# Patient Record
Sex: Male | Born: 1955
Health system: Southern US, Community
[De-identification: ages and names within clinical notes are randomized; demographics above are authoritative.]

## PROBLEM LIST (undated history)

## (undated) HISTORY — PX: EYELID CARCINOMA EXCISION: SHX1563

## (undated) HISTORY — PX: VASECTOMY: SHX75

---

## 1999-05-13 ENCOUNTER — Ambulatory Visit (HOSPITAL_BASED_OUTPATIENT_CLINIC_OR_DEPARTMENT_OTHER): Admission: RE | Admit: 1999-05-13 | Discharge: 1999-05-13 | Payer: Self-pay | Admitting: Podiatry

## 2001-10-19 ENCOUNTER — Encounter: Payer: Self-pay | Admitting: Gastroenterology

## 2001-10-19 ENCOUNTER — Encounter: Admission: RE | Admit: 2001-10-19 | Discharge: 2001-10-19 | Payer: Self-pay | Admitting: Gastroenterology

## 2002-10-02 ENCOUNTER — Ambulatory Visit (HOSPITAL_COMMUNITY): Admission: RE | Admit: 2002-10-02 | Discharge: 2002-10-02 | Payer: Self-pay | Admitting: Gastroenterology

## 2002-10-02 ENCOUNTER — Encounter: Payer: Self-pay | Admitting: Gastroenterology

## 2005-03-03 ENCOUNTER — Encounter (INDEPENDENT_AMBULATORY_CARE_PROVIDER_SITE_OTHER): Payer: Self-pay | Admitting: *Deleted

## 2005-03-03 ENCOUNTER — Ambulatory Visit (HOSPITAL_COMMUNITY): Admission: RE | Admit: 2005-03-03 | Discharge: 2005-03-03 | Payer: Self-pay | Admitting: Gastroenterology

## 2006-05-12 ENCOUNTER — Ambulatory Visit (HOSPITAL_COMMUNITY): Admission: RE | Admit: 2006-05-12 | Discharge: 2006-05-12 | Payer: Self-pay | Admitting: *Deleted

## 2006-06-18 ENCOUNTER — Ambulatory Visit (HOSPITAL_COMMUNITY): Admission: RE | Admit: 2006-06-18 | Discharge: 2006-06-18 | Payer: Self-pay | Admitting: Sports Medicine

## 2006-06-23 ENCOUNTER — Encounter: Admission: RE | Admit: 2006-06-23 | Discharge: 2006-06-23 | Payer: Self-pay | Admitting: Orthopedic Surgery

## 2006-07-14 ENCOUNTER — Encounter: Admission: RE | Admit: 2006-07-14 | Discharge: 2006-07-14 | Payer: Self-pay | Admitting: Orthopedic Surgery

## 2006-10-12 ENCOUNTER — Encounter: Admission: RE | Admit: 2006-10-12 | Discharge: 2006-10-12 | Payer: Self-pay | Admitting: Radiology

## 2010-09-24 NOTE — Op Note (Signed)
[place]. [facility]  Patient:    MARVIE CALENDER                        MRN: [id number] Proc. Date: 05/13/99 Adm. Date:  [id number] Attending:  Cordella Register                           Operative Report  INDICATIONS:  The patient presents to the office with extreme pain in the left first metacarpal phalangeal joint with the inability to bend the toe joint. This has been going on for an extended period of time.  The indications for surgery re chronic pain in his joint, with the inability to wear shoe gear.  SURGEON:  Alvia Grove Regal, D.P.M.  CONDITION:  Hallus valgus with hallux limitus deformity, left.  PROCEDURE:  Biplane osteotomy, first metatarsal, left, with pin fixation.  FINDINGS/DESCRIPTION OF PROCEDURE:  The patient was brought to the operating room and placed in the supine position on the operating room table.  The patient was  injected with a total of 10 cc of Xylocaine and Marcaine mixture.  The patients  left foot was prepped and draped utilizing a standard technique.  The left was exsanguinated utilizing an Esmarch.  The left ankle tourniquet was inflated to 00 mmHg.  The following procedure was performed:  [facility] biplane [place] bunionectomy, left foot, dorsal aspect, first metatarsal, where a linear incision was made just lateral to the extensor hallucis longus tendon.  The incision was deepened through the subcutaneous tissue at the level of the capsular where an inverted L-shaped  capsular tissue was performed.  The capsular tissue was sharply dissected off of the underlying bone, revealing a hyperostosis on the medial and dorsal aspect of the first metatarsal.  It had also some degeneration of cartilage on the plantar surface of the first metatarsal head.  The medial eminence and the dorsal eminence were resected flush with the shaft of the first metatarsal.  An osteotomy cut was then made in the first metatarsal head  with a biplaner double dorsal cut, and a  single plantar cut, so as to remove a wedge of dorsal bone, to allow for plantar flexion of the first metatarsal.  This was accomplished.  The first metatarsal as plantar-flexed, with fixation with a #0.045 K-wire. A rasp was introduced and all roughened bone edges were rasped smooth, and was flushed with copious amounts of sterile _______ solution.  The capsular tissues were reapproximated utilizing #3-0 Dexon in a continuous running fashion.  The subcutaneous tissue was reapproximated with sutures of #4-0 Dexon in a continuous running fashion.  The skin margins were reapproximated utilizing #5-0 Dexon in a subcuticular fashion.  The surgical site was infiltrated with 1 cc of dexamethasone, and a dry sterile compressive dressing applied to the left foot.  The left ankle tourniquet was deflated.  Capillary refill resulted in the left foot.  The patient was sent to the recovery room and was discharged by the department f anesthesia with postoperative instructions and medications. DD:  05/13/99 TD:  05/13/99 Job: [id number]

## 2010-09-24 NOTE — Op Note (Signed)
NAME:  Mitchell Jones, Mitchell Jones NO.:  [id number]   MEDICAL RECORD NO.:  [id number]          PATIENT TYPE:  AMB   LOCATION:  ENDO                         FACILITY:  [facility]   PHYSICIAN:  Petra Kuba, M.D.    DATE OF BIRTH:  1956/01/08   DATE OF PROCEDURE:  05/12/2006  DATE OF DISCHARGE:                               OPERATIVE REPORT   PROCEDURE:  Esophagogastroduodenoscopy with Savary dilatation.   INDICATIONS:  Dysphagia helped with dilatation in the past.  Consent was  signed after risks, benefits, methods, options thoroughly discussed in  the office on multiple occasions.   MEDICINES USED:  Fentanyl 50 mcg, Versed 6 mg.   PROCEDURE:  Video endoscope was inserted by direct vision.  The proximal  and mid esophagus were normal.  In the distal esophagus there was a  moderate amount of spasm.  He did have a tiny hiatal hernia with the 1.8  thin ring but very minimal inflammation was seen, but no other  abnormalities.  Scope passed in the stomach advanced through a normal  antrum.  Pylorus had a minimal amount of erythema and otherwise normal  pylorus into a normal duodenal bulb around the C-loop to a normal second  portion of the duodenum.  On slow withdrawal back to the bulb a slightly  bulbous ampulla was seen.  No other abnormalities.  Again a good look at  the bulb was normal.  Scope was withdrawn back the stomach and  retroflexed.  Cardia, fundus, angularis, lesser and greater curve were  normal on retroflex visualization.  Straight visualization of the  stomach did not reveal any additional findings.  Scope was slowly  withdrawn back to about 20 cm which again confirmed above esophageal  findings.  Scope was re-advanced to the antrum and under fluoro  guidance, the Savary wire was advanced.  The customary J loop to the  wire was confirmed endoscopically as well as fluoroscopically.  The  scope was removed under fluoro, making sure to keep the wire in proper  position.  Once the scope was removed.  The Savary 16-mm dilator only  was advanced under fluoro confirmed in the proper position in and  stomach.  There was no resistance in passing the dilator.  The wire was  withdrawn back into the dilator.  Both were removed in tandem.  There  was no heme on the dilator.  The procedure was terminated at this  junction.  The patient tolerated the procedure well.  There was no  obvious immediate complication.   ENDOSCOPIC DIAGNOSES:  1. Tiny hiatal hernia, very thin ring with minimal inflammation at the      gastroesophageal junction.  2. Moderate distal esophageal spasm.  3. Minimal peripyloric inflammation.  4. Bulbous ampulla.  5. Otherwise normal esophagogastroduodenoscopy.   THERAPY:  Savary dilatation under fluoro to 16 mm without heme and  resistance.   PLAN:  See how the dilation works, continue pump inhibitors, see back  p.r.n. or in 2 months to recheck symptoms to make sure no further more  aggressive therapy needed.  ______________________________  Petra Kuba, M.D.     MEM/MEDQ  D:  05/12/2006  T:  05/12/2006  Job:  [id number]   cc:   Theressa Millard, M.D.

## 2010-09-24 NOTE — Op Note (Signed)
NAME:  Mitchell Jones, Mitchell Jones                    ACCOUNT NO.:  [id number]   MEDICAL RECORD NO.:  [id number]                   PATIENT TYPE:  AMB   LOCATION:  ENDO                                 FACILITY:  [facility]   PHYSICIAN:  Petra Kuba, M.D.                 DATE OF BIRTH:  [birth year]   DATE OF PROCEDURE:  10/02/2002  DATE OF DISCHARGE:                                 OPERATIVE REPORT   PROCEDURE PERFORMED:  Esophagogastroduodenoscopy with Savary dilatation.   ENDOSCOPIST:  Petra Kuba, M.D.   INDICATIONS FOR PROCEDURE:  Patient with dysphagia.  Barium swallow  pertinent only for a small sliding hiatal hernia.  Consent was signed after  the risks, benefits, methods and options were thoroughly discussed in the  office.   MEDICINES USED:  Demerol 50 mg, Versed 7.5 mg.   DESCRIPTION OF PROCEDURE:  The video endoscope was inserted by direct  vision.  Esophagus was normal; however, in the distal esophagus was some  distal spasm.  There was a tiny hiatal hernia and a widened__________.  Scope passed into the stomach and advanced through a normal antrum, normal  pylorus into a normal duodenal bulb and around the C-loop to a normal second  portion of the duodenum.  The scope was withdrawn back to the bulb and a  good look there ruled out ulcers in that location.  Scope was withdrawn back  to the stomach and retroflexed.  The angularis, cardia and fundus were  normal.  The hiatal hernia was confirmed in the cardia.  The lesser and  greater curve were normal on retroflex and then straight visualization.  The  scope was then slowly withdrawn back to 20 cm. Again, no signs of  significant esophagitis or other abnormalities of the esophagus were seen.  The scope was readvanced into the antrum and under fluoro guidance, a Savary  wire was advanced.  The customary J-loop was confirmed under fluoroscopy.  The scope was removed and in succession, Savary 14 and 16 mm dilators were  both  advanced into the stomach, confirming proper position under  fluoroscopy.  Although he did not like [place] passing the dilators, there was no  resistance or heme on either of the dilators.  Both were confirmed in the  proper position in the stomach under fluoroscopy.  The wire was withdrawn  back into the 16 dilator.  Both were removed in tandem.  The patient  tolerated the procedure adequately.  There were no obvious immediate  complication.   ENDOSCOPIC DIAGNOSIS:  1. Tiny hiatal hernia.  Some gastroesophageal junction spasm.  Widely patent     thin ring.  2. Otherwise normal esophagogastroduodenoscopy.   THERAPY:  Savary dilatation to 16 mm under fluoroscopy without heme or  resistance.   PLAN:  See how the dilation works.  I have asked him to call me in one  month.  Consider a trial of pump inhibitors, antispasmodics,  possibly even  motility agents, more aggressive dilatations or even a manometry to rule out  atypical achalasia.  Have him call me sooner as needed.                                               Petra Kuba, M.D.    MEM/MEDQ  D:  10/02/2002  T:  10/02/2002  Job:  [id number]

## 2010-09-24 NOTE — Op Note (Signed)
NAME:  Mitchell Jones, Mitchell Jones NO.:  [id number]   MEDICAL RECORD NO.:  [id number]          PATIENT TYPE:  AMB   LOCATION:  ENDO                         FACILITY:  [facility]   PHYSICIAN:  Petra Kuba, M.D.    DATE OF BIRTH:  21-Nov-1955   DATE OF PROCEDURE:  03/03/2005  DATE OF DISCHARGE:                                 OPERATIVE REPORT   PROCEDURE:  Colonoscopy with biopsy.   INDICATIONS:  Screening.  Consent was signed after risks, benefits, methods,  options thoroughly discussed multiple times in the past.   MEDICINES USED:  Fentanyl 100 mcg Versed 8.   PROCEDURE:  Rectal inspection is pertinent for tiny external hemorrhoids.  Digital exam was negative. The video regular colonoscope was inserted.  Unfortunately at the sigmoid descending junction, was a tortuous turn and  despite some abdominal pressure and rolling him on his back, we could not  advance around the turn.  The scope was removed.  We went ahead and rolled  him back on his left side, and the pediatric video adjustable colonoscope  was inserted, and it was easier to advance around this tortuous curve.  Once  we were in this area with abdominal pressure, we were able to advance to the  cecum.  Other than a rare left-sided diverticula, no abnormality was seen on  insertion.  The cecum was identified by the appendiceal orifice and the  ileocecal valve.  In fact, the scope was inserted a short ways in the  terminal ileum which was normal.  Photodocumentation was obtained.  The  scope was slowly withdrawn.  Prep was adequate.  There was some liquid stool  that required washing and suctioning.  On slow withdrawal through the colon,  other than the rare left-sided diverticula and some tortuosity, no  abnormalities were seen until we withdrew back to the rectum where a tiny,  hyperplastic-appearing rectal polyp was seen, cold biopsied x2.  Anorectal  pull-through and retroflexion confirmed the tiny hemorrhoids.  Scope  was  straightened and readvanced a short ways up the left side of the colon.  Air  was suctioned, scope removed.  The patient tolerated the procedure  adequately.  There was no obvious immediate complication.   ENDOSCOPIC DIAGNOSES:  1.  Internal-external tiny hemorrhoids.  2.  Rare left-sided diverticula.  3.  Tiny rectal hyperplastic-appearing polyp, cold biopsied.  4.  Tortuous sigmoid descending junction, unable to advance the regular      scope but able to get the peds adjustable around it.  5.  Otherwise within normal limits to the terminal ileum.   PLAN:  Await pathology to determine future colonic screening.  If  hyperplastic, consider recheck in 5-10 years.  Might want to __________ next  time or even consider a virtual colonoscopy in the future or starting with  the peds colonoscope as above.  Happy to see back p.r.n., particularly if  upper tract symptoms return.  Otherwise, return care to Dr. Earl Gala for the  customary health care maintenance to include yearly rectals and guaiacs.  ______________________________  Petra Kuba, M.D.     MEM/MEDQ  D:  03/03/2005  T:  03/03/2005  Job:  [id number]   cc:   Theressa Millard, M.D.  Fax: [phone]

## 2011-09-09 ENCOUNTER — Other Ambulatory Visit: Payer: Self-pay | Admitting: Dermatology

## 2012-03-16 ENCOUNTER — Other Ambulatory Visit: Payer: Self-pay | Admitting: Dermatology

## 2013-04-25 ENCOUNTER — Other Ambulatory Visit: Payer: Self-pay | Admitting: Dermatology

## 2013-05-14 ENCOUNTER — Other Ambulatory Visit: Payer: Self-pay | Admitting: Dermatology

## 2013-08-01 DIAGNOSIS — M722 Plantar fascial fibromatosis: Secondary | ICD-10-CM

## 2013-08-02 ENCOUNTER — Encounter: Payer: Self-pay | Admitting: Podiatry

## 2013-09-20 ENCOUNTER — Encounter: Payer: Self-pay | Admitting: Podiatry

## 2014-02-13 ENCOUNTER — Other Ambulatory Visit: Payer: Self-pay | Admitting: Dermatology

## 2014-03-10 ENCOUNTER — Encounter: Payer: Self-pay | Admitting: Podiatry

## 2014-03-10 DIAGNOSIS — M722 Plantar fascial fibromatosis: Secondary | ICD-10-CM

## 2014-08-13 DIAGNOSIS — R52 Pain, unspecified: Secondary | ICD-10-CM

## 2015-03-25 ENCOUNTER — Ambulatory Visit (INDEPENDENT_AMBULATORY_CARE_PROVIDER_SITE_OTHER): Payer: 59 | Admitting: Podiatry

## 2015-03-25 ENCOUNTER — Ambulatory Visit (INDEPENDENT_AMBULATORY_CARE_PROVIDER_SITE_OTHER): Payer: 59

## 2015-03-25 DIAGNOSIS — L84 Corns and callosities: Secondary | ICD-10-CM

## 2015-03-25 DIAGNOSIS — M779 Enthesopathy, unspecified: Secondary | ICD-10-CM

## 2015-03-25 DIAGNOSIS — M216X9 Other acquired deformities of unspecified foot: Secondary | ICD-10-CM

## 2015-03-25 DIAGNOSIS — M79674 Pain in right toe(s): Secondary | ICD-10-CM

## 2015-03-25 MED ORDER — TRIAMCINOLONE ACETONIDE 10 MG/ML IJ SUSP
10.0000 mg | Freq: Once | INTRAMUSCULAR | Status: AC
  Administered 2015-03-25: 10 mg

## 2015-03-25 NOTE — Progress Notes (Signed)
   Subjective:    Patient ID: Mitchell Jones, male    DOB: August 20, 1955, 58 y.o.   MRN: [id number]  HPI  Pt presents with pain on the lateral side of his foot, currently has a callus lateral sub 5th met right foot  Review of Systems  All other systems reviewed and are negative.      Objective:   Physical Exam        Assessment & Plan:

## 2015-03-25 NOTE — Progress Notes (Signed)
Subjective:     Patient ID: Mitchell Jones, male   DOB: 08/23/1955, 59 y.o.   MRN: [id number]  HPI patient presents I have a lot of inflammation around the side of my right foot and it's been bothering me for a few months getting gradually worse and I think there might be some lesions there to. Patient states that it's been ongoing and gradually getting more symptomatic   Review of Systems  All other systems reviewed and are negative.      Objective:   Physical Exam  Constitutional: He is oriented to person, place, and time.  Cardiovascular: Intact distal pulses.   Musculoskeletal: Normal range of motion.  Neurological: He is oriented to person, place, and time.  Skin: Skin is warm.  Nursing note and vitals reviewed.  neurovascular status intact muscle strength adequate range of motion within normal limits with patient found to have keratotic lesion sub-fifth metatarsal right with inflammation and fluid around the metatarsal phalangeal joint that's painful. 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 Patient will be seen back as needed

## 2015-04-17 DIAGNOSIS — M779 Enthesopathy, unspecified: Secondary | ICD-10-CM

## 2015-04-27 ENCOUNTER — Other Ambulatory Visit: Payer: Self-pay | Admitting: Gastroenterology

## 2015-05-13 MED FILL — PANTOPRAZOLE SOD DR 20 MG T: 20 | 90 days supply | Qty: 90 | Fill #0

## 2015-05-21 MED FILL — ATORVASTATIN 20 MG TABLET: 20 | 30 days supply | Qty: 30 | Fill #0

## 2015-05-29 DIAGNOSIS — H524 Presbyopia: Secondary | ICD-10-CM | POA: Diagnosis not present

## 2015-05-29 DIAGNOSIS — H5203 Hypermetropia, bilateral: Secondary | ICD-10-CM | POA: Diagnosis not present

## 2015-05-29 MED FILL — CIALIS 20 MG TABLET: 20 | 30 days supply | Qty: 6 | Fill #9

## 2015-06-25 MED FILL — ATORVASTATIN 20 MG TABLET: 20 | 30 days supply | Qty: 30 | Fill #1

## 2015-06-29 MED FILL — CIALIS 20 MG TABLET: 20 | 30 days supply | Qty: 6 | Fill #10

## 2015-07-14 DIAGNOSIS — N138 Other obstructive and reflux uropathy: Secondary | ICD-10-CM | POA: Diagnosis not present

## 2015-07-14 DIAGNOSIS — N528 Other male erectile dysfunction: Secondary | ICD-10-CM | POA: Diagnosis not present

## 2015-07-14 DIAGNOSIS — N401 Enlarged prostate with lower urinary tract symptoms: Secondary | ICD-10-CM | POA: Diagnosis not present

## 2015-07-14 MED FILL — RAPAFLO 8 MG CAPSULE: 8 | 90 days supply | Qty: 90 | Fill #0

## 2015-07-17 DIAGNOSIS — L905 Scar conditions and fibrosis of skin: Secondary | ICD-10-CM | POA: Diagnosis not present

## 2015-07-17 DIAGNOSIS — Z85828 Personal history of other malignant neoplasm of skin: Secondary | ICD-10-CM | POA: Diagnosis not present

## 2015-07-17 DIAGNOSIS — L570 Actinic keratosis: Secondary | ICD-10-CM | POA: Diagnosis not present

## 2015-07-19 MED FILL — CIALIS 20 MG TABLET: 20 | 30 days supply | Qty: 6 | Fill #0

## 2015-07-23 MED FILL — ATORVASTATIN 20 MG TABLET: 20 | 30 days supply | Qty: 30 | Fill #2

## 2015-08-17 MED FILL — PANTOPRAZOLE SOD DR 20 MG T: 20 | 30 days supply | Qty: 30 | Fill #1

## 2015-08-28 MED FILL — ATORVASTATIN 20 MG TABLET: 20 | 30 days supply | Qty: 30 | Fill #3

## 2015-09-17 MED FILL — CIALIS 20 MG TABLET: 20 | 30 days supply | Qty: 6 | Fill #1

## 2015-09-22 MED FILL — ATORVASTATIN 20 MG TABLET: 20 | 30 days supply | Qty: 30 | Fill #4

## 2015-10-20 MED FILL — CIALIS 20 MG TABLET: 20 | 30 days supply | Qty: 6 | Fill #2

## 2015-10-26 MED FILL — ATORVASTATIN 20 MG TABLET: 20 | 30 days supply | Qty: 30 | Fill #5

## 2015-11-02 DIAGNOSIS — R1311 Dysphagia, oral phase: Secondary | ICD-10-CM | POA: Diagnosis not present

## 2015-11-02 DIAGNOSIS — K219 Gastro-esophageal reflux disease without esophagitis: Secondary | ICD-10-CM | POA: Diagnosis not present

## 2015-11-02 MED FILL — PANTOPRAZOLE SOD DR 40 MG T: 40 | 90 days supply | Qty: 90 | Fill #0

## 2015-11-17 MED FILL — CIALIS 20 MG TABLET: 20 | 30 days supply | Qty: 6 | Fill #3

## 2015-11-25 MED FILL — ATORVASTATIN 20 MG TABLET: 20 | 90 days supply | Qty: 90 | Fill #0

## 2015-12-28 MED FILL — CIALIS 20 MG TABLET: 20 | 30 days supply | Qty: 6 | Fill #4

## 2016-01-22 MED FILL — RAPAFLO 8 MG CAPSULE: 8 | 90 days supply | Qty: 90 | Fill #1

## 2016-01-28 MED FILL — CIALIS 20 MG TABLET: 20 | 30 days supply | Qty: 6 | Fill #5

## 2016-01-28 MED FILL — PANTOPRAZOLE SOD DR 40 MG T: 40 | 90 days supply | Qty: 90 | Fill #1

## 2016-03-03 MED FILL — ATORVASTATIN 20 MG TABLET: 20 | 90 days supply | Qty: 90 | Fill #1

## 2016-03-03 MED FILL — CIALIS 20 MG TABLET: 20 | 30 days supply | Qty: 6 | Fill #6

## 2016-03-21 DIAGNOSIS — Z125 Encounter for screening for malignant neoplasm of prostate: Secondary | ICD-10-CM | POA: Diagnosis not present

## 2016-03-21 DIAGNOSIS — R7301 Impaired fasting glucose: Secondary | ICD-10-CM | POA: Diagnosis not present

## 2016-03-21 DIAGNOSIS — Z0000 Encounter for general adult medical examination without abnormal findings: Secondary | ICD-10-CM | POA: Diagnosis not present

## 2016-03-21 DIAGNOSIS — E784 Other hyperlipidemia: Secondary | ICD-10-CM | POA: Diagnosis not present

## 2016-03-28 DIAGNOSIS — K219 Gastro-esophageal reflux disease without esophagitis: Secondary | ICD-10-CM | POA: Diagnosis not present

## 2016-03-28 DIAGNOSIS — R7301 Impaired fasting glucose: Secondary | ICD-10-CM | POA: Diagnosis not present

## 2016-03-28 DIAGNOSIS — F5221 Male erectile disorder: Secondary | ICD-10-CM | POA: Diagnosis not present

## 2016-03-28 DIAGNOSIS — E781 Pure hyperglyceridemia: Secondary | ICD-10-CM | POA: Diagnosis not present

## 2016-03-28 DIAGNOSIS — Z8719 Personal history of other diseases of the digestive system: Secondary | ICD-10-CM | POA: Diagnosis not present

## 2016-03-28 DIAGNOSIS — R030 Elevated blood-pressure reading, without diagnosis of hypertension: Secondary | ICD-10-CM | POA: Diagnosis not present

## 2016-03-28 DIAGNOSIS — Z23 Encounter for immunization: Secondary | ICD-10-CM | POA: Diagnosis not present

## 2016-03-28 DIAGNOSIS — Z0000 Encounter for general adult medical examination without abnormal findings: Secondary | ICD-10-CM | POA: Diagnosis not present

## 2016-03-28 DIAGNOSIS — E8881 Metabolic syndrome: Secondary | ICD-10-CM | POA: Diagnosis not present

## 2016-03-28 DIAGNOSIS — E668 Other obesity: Secondary | ICD-10-CM | POA: Diagnosis not present

## 2016-03-28 DIAGNOSIS — Z1389 Encounter for screening for other disorder: Secondary | ICD-10-CM | POA: Diagnosis not present

## 2016-03-29 DIAGNOSIS — Z1212 Encounter for screening for malignant neoplasm of rectum: Secondary | ICD-10-CM | POA: Diagnosis not present

## 2016-04-03 MED FILL — CIALIS 20 MG TABLET: 20 | 30 days supply | Qty: 6 | Fill #7

## 2016-04-27 MED FILL — PANTOPRAZOLE SOD DR 40 MG T: 40 | 90 days supply | Qty: 90 | Fill #2

## 2016-04-28 DIAGNOSIS — Z85828 Personal history of other malignant neoplasm of skin: Secondary | ICD-10-CM | POA: Diagnosis not present

## 2016-04-28 DIAGNOSIS — L814 Other melanin hyperpigmentation: Secondary | ICD-10-CM | POA: Diagnosis not present

## 2016-04-28 DIAGNOSIS — D1801 Hemangioma of skin and subcutaneous tissue: Secondary | ICD-10-CM | POA: Diagnosis not present

## 2016-04-28 DIAGNOSIS — L570 Actinic keratosis: Secondary | ICD-10-CM | POA: Diagnosis not present

## 2016-04-28 DIAGNOSIS — L821 Other seborrheic keratosis: Secondary | ICD-10-CM | POA: Diagnosis not present

## 2016-04-28 MED FILL — FLUOCINONIDE 0.05% CREAM: 0.05 | 21 days supply | Qty: 30 | Fill #0

## 2016-05-06 MED FILL — CIALIS 20 MG TABLET: 20 | 30 days supply | Qty: 6 | Fill #8

## 2016-06-03 DIAGNOSIS — H52202 Unspecified astigmatism, left eye: Secondary | ICD-10-CM | POA: Diagnosis not present

## 2016-06-03 DIAGNOSIS — H524 Presbyopia: Secondary | ICD-10-CM | POA: Diagnosis not present

## 2016-06-03 DIAGNOSIS — H5203 Hypermetropia, bilateral: Secondary | ICD-10-CM | POA: Diagnosis not present

## 2016-06-06 DIAGNOSIS — H02401 Unspecified ptosis of right eyelid: Secondary | ICD-10-CM | POA: Diagnosis not present

## 2016-06-08 MED FILL — RAPAFLO 8 MG CAPSULE: 8 | 90 days supply | Qty: 90 | Fill #2

## 2016-06-08 MED FILL — ATORVASTATIN 20 MG TABLET: 20 | 30 days supply | Qty: 30 | Fill #2

## 2016-06-13 MED FILL — CIALIS 20 MG TABLET: 20 | 30 days supply | Qty: 6 | Fill #9

## 2016-07-12 MED FILL — ATORVASTATIN 20 MG TABLET: 20 | 90 days supply | Qty: 90 | Fill #0

## 2016-07-14 DIAGNOSIS — H02834 Dermatochalasis of left upper eyelid: Secondary | ICD-10-CM | POA: Diagnosis not present

## 2016-07-14 DIAGNOSIS — N529 Male erectile dysfunction, unspecified: Secondary | ICD-10-CM | POA: Diagnosis not present

## 2016-07-14 DIAGNOSIS — H02413 Mechanical ptosis of bilateral eyelids: Secondary | ICD-10-CM | POA: Diagnosis not present

## 2016-07-14 DIAGNOSIS — H02831 Dermatochalasis of right upper eyelid: Secondary | ICD-10-CM | POA: Diagnosis not present

## 2016-07-14 DIAGNOSIS — H0279 Other degenerative disorders of eyelid and periocular area: Secondary | ICD-10-CM | POA: Diagnosis not present

## 2016-07-14 DIAGNOSIS — H53483 Generalized contraction of visual field, bilateral: Secondary | ICD-10-CM | POA: Diagnosis not present

## 2016-07-14 DIAGNOSIS — N400 Enlarged prostate without lower urinary tract symptoms: Secondary | ICD-10-CM | POA: Diagnosis not present

## 2016-07-14 DIAGNOSIS — H02423 Myogenic ptosis of bilateral eyelids: Secondary | ICD-10-CM | POA: Diagnosis not present

## 2016-07-20 MED FILL — SILDENAFIL 20 MG TABLET: 20 | 30 days supply | Qty: 30 | Fill #0

## 2016-07-27 MED FILL — PANTOPRAZOLE SOD DR 40 MG T: 40 | 90 days supply | Qty: 90 | Fill #3

## 2016-08-24 MED FILL — CIALIS 20 MG TABLET: 20 | 30 days supply | Qty: 6 | Fill #0

## 2016-10-11 DIAGNOSIS — K219 Gastro-esophageal reflux disease without esophagitis: Secondary | ICD-10-CM | POA: Diagnosis not present

## 2016-10-11 MED FILL — NEO/POLY/DEXAMET EYE OINT: 3.5-10000-0 | 3 days supply | Qty: 4 | Fill #0

## 2016-10-17 DIAGNOSIS — H02831 Dermatochalasis of right upper eyelid: Secondary | ICD-10-CM | POA: Diagnosis not present

## 2016-10-17 DIAGNOSIS — H53483 Generalized contraction of visual field, bilateral: Secondary | ICD-10-CM | POA: Diagnosis not present

## 2016-10-17 DIAGNOSIS — H02413 Mechanical ptosis of bilateral eyelids: Secondary | ICD-10-CM | POA: Diagnosis not present

## 2016-10-17 DIAGNOSIS — H02423 Myogenic ptosis of bilateral eyelids: Secondary | ICD-10-CM | POA: Diagnosis not present

## 2016-10-17 DIAGNOSIS — H0279 Other degenerative disorders of eyelid and periocular area: Secondary | ICD-10-CM | POA: Diagnosis not present

## 2016-10-17 DIAGNOSIS — H02834 Dermatochalasis of left upper eyelid: Secondary | ICD-10-CM | POA: Diagnosis not present

## 2016-10-17 MED FILL — ATORVASTATIN 20 MG TABLET: 20 | 90 days supply | Qty: 90 | Fill #1

## 2016-10-26 DIAGNOSIS — Z85828 Personal history of other malignant neoplasm of skin: Secondary | ICD-10-CM | POA: Diagnosis not present

## 2016-10-26 DIAGNOSIS — L570 Actinic keratosis: Secondary | ICD-10-CM | POA: Diagnosis not present

## 2016-10-26 DIAGNOSIS — L821 Other seborrheic keratosis: Secondary | ICD-10-CM | POA: Diagnosis not present

## 2016-10-26 DIAGNOSIS — D2271 Melanocytic nevi of right lower limb, including hip: Secondary | ICD-10-CM | POA: Diagnosis not present

## 2016-10-26 DIAGNOSIS — D2272 Melanocytic nevi of left lower limb, including hip: Secondary | ICD-10-CM | POA: Diagnosis not present

## 2016-10-26 DIAGNOSIS — L814 Other melanin hyperpigmentation: Secondary | ICD-10-CM | POA: Diagnosis not present

## 2016-10-26 DIAGNOSIS — D1801 Hemangioma of skin and subcutaneous tissue: Secondary | ICD-10-CM | POA: Diagnosis not present

## 2016-10-26 DIAGNOSIS — L718 Other rosacea: Secondary | ICD-10-CM | POA: Diagnosis not present

## 2016-10-31 MED FILL — PANTOPRAZOLE SOD DR 40 MG T: 40 | 90 days supply | Qty: 90 | Fill #0

## 2016-11-01 MED FILL — SSS 10-5 CREAM: 10-5 | 14 days supply | Qty: 28 | Fill #0

## 2016-11-14 MED FILL — CIALIS 20 MG TABLET: 20 | 30 days supply | Qty: 6 | Fill #1

## 2017-01-21 MED FILL — CIALIS 20 MG TABLET: 20 | 30 days supply | Qty: 6 | Fill #2

## 2017-01-23 MED FILL — ATORVASTATIN 20 MG TABLET: 20 | 30 days supply | Qty: 30 | Fill #2

## 2017-03-07 MED FILL — ATORVASTATIN 20 MG TABLET: 20 | 30 days supply | Qty: 30 | Fill #0

## 2017-03-07 MED FILL — PANTOPRAZOLE SOD DR 40 MG T: 40 | 90 days supply | Qty: 90 | Fill #1

## 2017-03-13 MED FILL — FLUOCINONIDE 0.05% CREAM: 0.05 | 21 days supply | Qty: 30 | Fill #1

## 2017-03-22 DIAGNOSIS — Z0000 Encounter for general adult medical examination without abnormal findings: Secondary | ICD-10-CM | POA: Diagnosis not present

## 2017-03-22 DIAGNOSIS — Z125 Encounter for screening for malignant neoplasm of prostate: Secondary | ICD-10-CM | POA: Diagnosis not present

## 2017-03-22 DIAGNOSIS — R7301 Impaired fasting glucose: Secondary | ICD-10-CM | POA: Diagnosis not present

## 2017-03-22 DIAGNOSIS — E7849 Other hyperlipidemia: Secondary | ICD-10-CM | POA: Diagnosis not present

## 2017-03-29 DIAGNOSIS — L718 Other rosacea: Secondary | ICD-10-CM | POA: Diagnosis not present

## 2017-03-29 DIAGNOSIS — E8881 Metabolic syndrome: Secondary | ICD-10-CM | POA: Diagnosis not present

## 2017-03-29 DIAGNOSIS — Z1389 Encounter for screening for other disorder: Secondary | ICD-10-CM | POA: Diagnosis not present

## 2017-03-29 DIAGNOSIS — E668 Other obesity: Secondary | ICD-10-CM | POA: Diagnosis not present

## 2017-03-29 DIAGNOSIS — E7849 Other hyperlipidemia: Secondary | ICD-10-CM | POA: Diagnosis not present

## 2017-03-29 DIAGNOSIS — K219 Gastro-esophageal reflux disease without esophagitis: Secondary | ICD-10-CM | POA: Diagnosis not present

## 2017-03-29 DIAGNOSIS — R7301 Impaired fasting glucose: Secondary | ICD-10-CM | POA: Diagnosis not present

## 2017-03-29 DIAGNOSIS — Z8719 Personal history of other diseases of the digestive system: Secondary | ICD-10-CM | POA: Diagnosis not present

## 2017-03-29 DIAGNOSIS — Z23 Encounter for immunization: Secondary | ICD-10-CM | POA: Diagnosis not present

## 2017-03-29 DIAGNOSIS — Z0000 Encounter for general adult medical examination without abnormal findings: Secondary | ICD-10-CM | POA: Diagnosis not present

## 2017-03-29 DIAGNOSIS — R030 Elevated blood-pressure reading, without diagnosis of hypertension: Secondary | ICD-10-CM | POA: Diagnosis not present

## 2017-03-31 DIAGNOSIS — Z1212 Encounter for screening for malignant neoplasm of rectum: Secondary | ICD-10-CM | POA: Diagnosis not present

## 2017-04-07 MED FILL — ATORVASTATIN 20 MG TABLET: 20 | 30 days supply | Qty: 30 | Fill #1

## 2017-04-07 MED FILL — TADALAFIL 20 MG TABS: 20 | 30 days supply | Qty: 6 | Fill #3

## 2017-04-20 DIAGNOSIS — L821 Other seborrheic keratosis: Secondary | ICD-10-CM | POA: Diagnosis not present

## 2017-04-20 DIAGNOSIS — L570 Actinic keratosis: Secondary | ICD-10-CM | POA: Diagnosis not present

## 2017-04-20 DIAGNOSIS — B353 Tinea pedis: Secondary | ICD-10-CM | POA: Diagnosis not present

## 2017-04-20 DIAGNOSIS — L814 Other melanin hyperpigmentation: Secondary | ICD-10-CM | POA: Diagnosis not present

## 2017-04-20 DIAGNOSIS — D1801 Hemangioma of skin and subcutaneous tissue: Secondary | ICD-10-CM | POA: Diagnosis not present

## 2017-04-20 DIAGNOSIS — Z85828 Personal history of other malignant neoplasm of skin: Secondary | ICD-10-CM | POA: Diagnosis not present

## 2017-05-04 MED FILL — ATORVASTATIN 20 MG TABLET: 20 | 30 days supply | Qty: 30 | Fill #0

## 2017-05-19 MED FILL — TADALAFIL 20 MG TABS: 20 | 30 days supply | Qty: 6 | Fill #4

## 2017-06-08 DIAGNOSIS — H524 Presbyopia: Secondary | ICD-10-CM | POA: Diagnosis not present

## 2017-06-08 DIAGNOSIS — H5203 Hypermetropia, bilateral: Secondary | ICD-10-CM | POA: Diagnosis not present

## 2017-06-10 MED FILL — PANTOPRAZOLE SOD DR 40 MG T: 40 | 90 days supply | Qty: 90 | Fill #2

## 2017-06-11 MED FILL — ATORVASTATIN 20 MG TABLET: 20 | 30 days supply | Qty: 30 | Fill #1

## 2017-07-13 MED FILL — TADALAFIL 20 MG TABS: 20 | 30 days supply | Qty: 6 | Fill #5

## 2017-07-17 MED FILL — ATORVASTATIN 20 MG TABLET: 20 | 30 days supply | Qty: 30 | Fill #2

## 2017-08-16 MED FILL — ATORVASTATIN 20 MG TABLET: 20 | 30 days supply | Qty: 30 | Fill #3

## 2017-08-16 MED FILL — TADALAFIL 20 MG TABS: 20 | 30 days supply | Qty: 6 | Fill #6

## 2017-09-07 DIAGNOSIS — Z85828 Personal history of other malignant neoplasm of skin: Secondary | ICD-10-CM | POA: Diagnosis not present

## 2017-09-07 DIAGNOSIS — L821 Other seborrheic keratosis: Secondary | ICD-10-CM | POA: Diagnosis not present

## 2017-09-20 MED FILL — PANTOPRAZOLE SOD DR 40 MG T: 40 | 90 days supply | Qty: 90 | Fill #3

## 2017-09-21 MED FILL — ATORVASTATIN 20 MG TABLET: 20 | 30 days supply | Qty: 30 | Fill #4

## 2017-09-25 MED FILL — SSS 10-5 CREAM: 10-5 | 14 days supply | Qty: 28 | Fill #1

## 2017-10-18 MED FILL — TADALAFIL 20 MG TABS: 20 | 30 days supply | Qty: 6 | Fill #0

## 2017-10-26 DIAGNOSIS — D1801 Hemangioma of skin and subcutaneous tissue: Secondary | ICD-10-CM | POA: Diagnosis not present

## 2017-10-26 DIAGNOSIS — Z85828 Personal history of other malignant neoplasm of skin: Secondary | ICD-10-CM | POA: Diagnosis not present

## 2017-10-26 DIAGNOSIS — D225 Melanocytic nevi of trunk: Secondary | ICD-10-CM | POA: Diagnosis not present

## 2017-10-26 DIAGNOSIS — L821 Other seborrheic keratosis: Secondary | ICD-10-CM | POA: Diagnosis not present

## 2017-10-26 DIAGNOSIS — L570 Actinic keratosis: Secondary | ICD-10-CM | POA: Diagnosis not present

## 2017-10-26 DIAGNOSIS — D2271 Melanocytic nevi of right lower limb, including hip: Secondary | ICD-10-CM | POA: Diagnosis not present

## 2017-10-26 MED FILL — FLUOROURACIL 5% CREAM: 5 | 10 days supply | Qty: 40 | Fill #0

## 2017-10-28 MED FILL — ATORVASTATIN 20 MG TABLET: 20 | 30 days supply | Qty: 30 | Fill #5

## 2017-11-01 ENCOUNTER — Encounter: Payer: 59 | Attending: Internal Medicine | Admitting: Registered"

## 2017-11-01 ENCOUNTER — Encounter: Payer: Self-pay | Admitting: Registered"

## 2017-11-01 DIAGNOSIS — Z713 Dietary counseling and surveillance: Secondary | ICD-10-CM | POA: Insufficient documentation

## 2017-11-01 NOTE — Patient Instructions (Addendum)
-   Aim to have more well-balanced meals. See handout.   - Have non-starchy vegetables with lunch and dinner.

## 2017-11-01 NOTE — Progress Notes (Signed)
  Medical Nutrition Therapy:  Appt start time: 2:15 end time:  3:00.  Pt prefers to be called Mitchell Jones.   Pt expectations: a plan to become more cognizant instead of being impulsive.   Assessment:  Primary concerns today: Pt states he eats what he likes. Pt states he wants to lose weight. Pt states he likes to golf and wants to be more flexible. Pt states he is very active walking 2 miles/day, 7 days a week regardless of warm/cool months. Pt states he naturally gains more weight in winter and loses weight in summer. Pt states he would like to be less than 200 lbs, but would be satisfied with being less than 220 lbs.    Pt states he loves chicken salad from [employer] and BLTs. Pt states he does not have a lot of variation with eating.   Pt states he works from home 9-6pm, typically wakes up at 5:30-6:30am and has breakfast before 7am. Pt states he drinks a good amount of water more in the morning versus in afternoon.   Pt likes encouragement and support.   Preferred Learning Style:   No preference indicated   Learning Readiness:   Ready  Change in progress   MEDICATIONS: See list   DIETARY INTAKE:  Usual eating pattern includes 2 meals and 1-2 snacks per day.  Everyday foods include protein shakes, protein bar, fast foods, sandwiches.  Avoided foods include apples.    24-hr recall:  B (7 AM): nutrigrain bar + coffee Snk ( AM): protein shake or protein bar  L (2-3 PM): peanut butter, banana sandwich or omelet (cheese, mushroom) fruit, bacon, and milk or [employer] or BLT Snk ( PM): none D (9 PM): Wendy's-cheeseburger, frosty Snk ( PM): small bowl of ice cream Beverages: coffee (16 oz), alcohol (weekends-cocktails, wine), water, diet soda (occasionally), diet tonic and tonic water  Usual physical activity: walks 2 miles/day, 7days/week, yoga 60 min, 1-2x/week  Estimated energy needs: 2000 calories 225 g carbohydrates 150 g protein 56 g fat  Progress Towards  Goal(s):  In progress.   Nutritional Diagnosis:  NI-5.8.5 Inadeqate fiber intake As related to less than optimal food-preparation practices.  As evidenced by pt report of reliance on overprocessed foods.    Intervention:  Nutrition education and counseling. Pt was educated and counseled on the importance of increasing fiber intake, My Plate, and eating well-balanced meals. Pt was in agreement with goals listed.  Goals: - Aim to have more well-balanced meals. See handout.  - Have non-starchy vegetables with lunch and dinner.   Teaching Method Utilized:  Visual Auditory Hands on  Handouts given during visit include:  My Plate  Barriers to learning/adherence to lifestyle change: none identified  Demonstrated degree of understanding via:  Teach Back   Monitoring/Evaluation:  Dietary intake, exercise, and body weight in 1 month(s).

## 2017-11-20 ENCOUNTER — Encounter: Payer: Self-pay | Admitting: Registered"

## 2017-11-20 ENCOUNTER — Encounter: Payer: 59 | Attending: Internal Medicine | Admitting: Registered"

## 2017-11-20 DIAGNOSIS — Z713 Dietary counseling and surveillance: Secondary | ICD-10-CM | POA: Insufficient documentation

## 2017-11-20 NOTE — Progress Notes (Signed)
  Medical Nutrition Therapy:  Appt start time: 3:15 end time: 3:41.  Pt prefers to be called Mitchell Jones.   Pt expectations: a plan to become more cognizant instead of being impulsive.   Assessment:  Primary concerns today:   Pt states things are better with eating more well-balanced meals. Pt states he has been more cognizant of what he is eating. Pt states he is still trying to eat well-balanced meals. Pt states he played golf yesterday and went well, except towards the end when he was getting tired.   Pt states he eats what he likes. Pt states he wants to lose weight. Pt states he likes to golf and wants to be more flexible. Pt states he is very active walking 2 miles/day, 7 days a week regardless of warm/cool months. Pt states he naturally gains more weight in winter and loses weight in summer. Pt states he would like to be less than 200 lbs, but would be satisfied with being less than 220 lbs.    Pt states he loves chicken salad from [employer] and BLTs. Pt states he does not have a lot of variation with eating.   Pt states he works from home 9-6pm, typically wakes up at 5:30-6:30am and has breakfast before 7am. Pt states he drinks a good amount of water more in the morning versus in afternoon.   Pt likes encouragement and support.   Preferred Learning Style:   No preference indicated   Learning Readiness:   Ready  Change in progress   MEDICATIONS: See list   DIETARY INTAKE:  Usual eating pattern includes 2 meals and 1-2 snacks per day.  Everyday foods include protein shakes, protein bar, fast foods, sandwiches.  Avoided foods include apples.    24-hr recall:  B (7 AM): nutrigrain bar + coffee Snk (9:30-10 AM): fresh fruit, toast with swiss cheese or protein shake or protein bar  L (2-3 PM): peanut butter, honey, banana sandwich or omelet (cheese, mushroom) fruit, bacon, and milk or [employer] or BLT Snk ( PM): sometimes protein bar (21g of protein) D (9 PM):  [employer]-cheeseburger, frosty Snk ( PM): small bowl of ice cream Beverages: coffee (16 oz), alcohol (weekends-cocktails, wine), water, diet soda (occasionally), diet tonic and tonic water  Usual physical activity: walks 2 miles/day, 7days/week, yoga 60 min, 1-2x/week  Estimated energy needs: 2000 calories 225 g carbohydrates 150 g protein 56 g fat  Progress Towards Goal(s):  In progress.   Nutritional Diagnosis:  NI-5.8.5 Inadeqate fiber intake As related to less than optimal food-preparation practices.  As evidenced by pt report of reliance on overprocessed foods.    Intervention:  Nutrition education and counseling. Pt was educated and counseled on the importance of not skipping meals and increasing vegetable intake during the day. Pt was in agreement with goals listed.  Goals: - Aim to have more well-balanced meals.  - Add fresh vegetables such as celery, carrots, broccoli, cherry tomatoes, etc with peanut butter sandwich.  - Aim to have non-starchy vegetables with dinner.   Teaching Method Utilized:  Visual Auditory Hands on  Handouts given during visit include:  none  Barriers to learning/adherence to lifestyle change: none identified  Demonstrated degree of understanding via:  Teach Back   Monitoring/Evaluation:  Dietary intake, exercise, and body weight in 1 month(s).

## 2017-11-20 NOTE — Patient Instructions (Addendum)
-   Aim to have more well-balanced meals.   - Add fresh vegetables such as celery, carrots, broccoli, cherry tomatoes, etc with peanut butter sandwich.   - Aim to have non-starchy vegetables with dinner.

## 2017-11-29 MED FILL — ATORVASTATIN CALCIUM 20 MG: 20 | 30 days supply | Qty: 30 | Fill #6

## 2017-12-11 MED FILL — TADALAFIL 20 MG TABS: 20 | 30 days supply | Qty: 6 | Fill #1

## 2017-12-18 ENCOUNTER — Encounter: Payer: 59 | Attending: Internal Medicine | Admitting: Registered"

## 2017-12-18 ENCOUNTER — Encounter: Payer: Self-pay | Admitting: Registered"

## 2017-12-18 DIAGNOSIS — Z713 Dietary counseling and surveillance: Secondary | ICD-10-CM | POA: Insufficient documentation

## 2017-12-18 NOTE — Patient Instructions (Addendum)
-   Continue to add fresh vegetables such as celery, carrots, broccoli, cherry tomatoes, etc with peanut butter sandwich.   - Continue to aim to have non-starchy vegetables with dinner.   - Plan ahead for upcoming week for meals that are typically quick meals.   - You're doing a great job with being more cognizant of daily habits.

## 2017-12-18 NOTE — Progress Notes (Signed)
Medical Nutrition Therapy:  Appt start time: 11:40 end time: 12:00  Pt prefers to be called Mitchell Jones.   Pt expectations: a plan to become more cognizant instead of being impulsive.   Assessment:  Primary concerns today:   Pt states he has been having a right hamstring injury which has limited him. Pt states he has not been golfing as much this summer. Pt states he is walking daily and more cognizant when on the golf course. Pt states he will have a Cliff bar while on the golf course for 4 hours. Pt reports averaging 12,000-15,000 steps a day.   Pt states things are better with eating more well-balanced meals. Pt states he has been more cognizant of what he is eating. Pt states he is still trying to eat well-balanced meals. Pt states he played golf yesterday and went well, except towards the end when he was getting tired.   Pt states he eats what he likes. Pt states he wants to lose weight. Pt states he likes to golf and wants to be more flexible. Pt states he is very active walking 2 miles/day, 7 days a week regardless of warm/cool months. Pt states he naturally gains more weight in winter and loses weight in summer. Pt states he would like to be less than 200 lbs, but would be satisfied with being less than 220 lbs.    Pt states he loves chicken salad from [employer] and BLTs. Pt states he does not have a lot of variation with eating.   Pt states he works from home 9-6pm, typically wakes up at 5:30-6:30am and has breakfast before 7am. Pt states he drinks a good amount of water more in the morning versus in afternoon.   Pt likes encouragement and support.   Preferred Learning Style:   No preference indicated   Learning Readiness:   Ready  Change in progress   MEDICATIONS: See list   DIETARY INTAKE:  Usual eating pattern includes 2 meals and 1-2 snacks per day.  Everyday foods include protein shakes, protein bar, fast foods, sandwiches.  Avoided foods include apples.     24-hr recall:  B (7 AM): nutrigrain bar + coffee Snk (9:30-10 AM): fresh fruit, toast with swiss cheese or protein shake or protein bar  L (2-3 PM): peanut butter, honey, banana sandwich or omelet (cheese, mushroom) fruit, bacon, and milk or [employer] or BLT Snk ( PM): sometimes protein bar (21g of protein) D (9 PM): Wendy's-cheeseburger, frosty Snk ( PM): small bowl of ice cream Beverages: coffee (16 oz), alcohol (weekends-cocktails, wine), water, diet soda (occasionally), diet tonic and tonic water, gatorade (  Usual physical activity: walks 2 miles/day, 7days/week, yoga 60 min, 1-2x/week, yardwork   Estimated energy needs: 2000 calories 225 g carbohydrates 150 g protein 56 g fat  Progress Towards Goal(s):  In progress.   Nutritional Diagnosis:  NI-5.8.5 Inadeqate fiber intake As related to less than optimal food-preparation practices.  As evidenced by pt report of reliance on overprocessed foods.    Intervention:  Nutrition education and counseling. Pt was educated and counseled on the importance of meal planning. Pt was in agreement with goals listed.  Goals: - Continue to add fresh vegetables such as celery, carrots, broccoli, cherry tomatoes, etc with peanut butter sandwich.  - Continue to aim to have non-starchy vegetables with dinner.  - Plan ahead for upcoming week for meals that are typically quick meals.  - You're doing a great job with being more cognizant of  daily habits.   Teaching Method Utilized:  Visual Auditory Hands on  Handouts given during visit include:  none  Barriers to learning/adherence to lifestyle change: none identified  Demonstrated degree of understanding via:  Teach Back   Monitoring/Evaluation:  Dietary intake, exercise, and body weight prn.

## 2018-01-09 MED FILL — ATORVASTATIN CALCIUM 20 MG: 20 | 90 days supply | Qty: 90 | Fill #0

## 2018-01-09 MED FILL — PANTOPRAZOLE SOD DR 40 MG T: 40 | 90 days supply | Qty: 90 | Fill #0

## 2018-02-05 MED FILL — CIALIS 20 MG TABLET: 20 | 30 days supply | Qty: 6 | Fill #2

## 2018-04-03 DIAGNOSIS — R82998 Other abnormal findings in urine: Secondary | ICD-10-CM | POA: Diagnosis not present

## 2018-04-03 DIAGNOSIS — R7301 Impaired fasting glucose: Secondary | ICD-10-CM | POA: Diagnosis not present

## 2018-04-03 DIAGNOSIS — Z0000 Encounter for general adult medical examination without abnormal findings: Secondary | ICD-10-CM | POA: Diagnosis not present

## 2018-04-03 DIAGNOSIS — Z125 Encounter for screening for malignant neoplasm of prostate: Secondary | ICD-10-CM | POA: Diagnosis not present

## 2018-04-04 DIAGNOSIS — Z1212 Encounter for screening for malignant neoplasm of rectum: Secondary | ICD-10-CM | POA: Diagnosis not present

## 2018-04-18 DIAGNOSIS — L821 Other seborrheic keratosis: Secondary | ICD-10-CM | POA: Diagnosis not present

## 2018-04-18 DIAGNOSIS — L918 Other hypertrophic disorders of the skin: Secondary | ICD-10-CM | POA: Diagnosis not present

## 2018-04-18 DIAGNOSIS — Z85828 Personal history of other malignant neoplasm of skin: Secondary | ICD-10-CM | POA: Diagnosis not present

## 2018-04-18 DIAGNOSIS — L570 Actinic keratosis: Secondary | ICD-10-CM | POA: Diagnosis not present

## 2018-04-18 DIAGNOSIS — D1801 Hemangioma of skin and subcutaneous tissue: Secondary | ICD-10-CM | POA: Diagnosis not present

## 2018-04-19 MED FILL — PANTOPRAZOLE SOD DR 40 MG T: 40 | 90 days supply | Qty: 90 | Fill #0

## 2018-04-19 MED FILL — TADALAFIL 20 MG TABS: 20 | 30 days supply | Qty: 6 | Fill #3

## 2018-04-19 MED FILL — ATORVASTATIN CALCIUM 20 MG: 20 | 90 days supply | Qty: 90 | Fill #1

## 2018-05-05 ENCOUNTER — Ambulatory Visit (INDEPENDENT_AMBULATORY_CARE_PROVIDER_SITE_OTHER): Payer: Self-pay | Admitting: Family Medicine

## 2018-05-05 VITALS — BP 145/75 | HR 92 | Temp 98.5°F | Resp 18 | Wt 250.4 lb

## 2018-05-05 DIAGNOSIS — J019 Acute sinusitis, unspecified: Secondary | ICD-10-CM

## 2018-05-05 DIAGNOSIS — R05 Cough: Secondary | ICD-10-CM

## 2018-05-05 DIAGNOSIS — R079 Chest pain, unspecified: Secondary | ICD-10-CM

## 2018-05-05 DIAGNOSIS — R059 Cough, unspecified: Secondary | ICD-10-CM

## 2018-05-05 MED ORDER — AZELASTINE HCL 0.1 % NA SOLN
1.0000 | Freq: Two times a day (BID) | NASAL | 0 refills | Status: AC
Start: 2018-05-05 — End: ?

## 2018-05-05 MED ORDER — PSEUDOEPH-BROMPHEN-DM 30-2-10 MG/5ML PO SYRP: 10.0000 mL | ORAL_SOLUTION | Freq: Three times a day (TID) | ORAL | 0 refills | Status: DC | PRN

## 2018-05-05 MED ORDER — ALBUTEROL SULFATE HFA 108 (90 BASE) MCG/ACT IN AERS: 2.0000 | INHALATION_SPRAY | Freq: Four times a day (QID) | RESPIRATORY_TRACT | 0 refills | Status: DC | PRN

## 2018-05-05 NOTE — Patient Instructions (Addendum)
Seek evaluation and Urgent Care/ER for evaluation of chest pain Will provide treatment for 2 day cough and cold symptoms  Nonspecific Chest Pain  Chest pain can be caused by many different conditions. Some causes of chest pain can be life-threatening. These will require treatment right away. Serious causes of chest pain include:  Heart attack.  A tear in the body's main blood vessel.  Redness and swelling (inflammation) around your heart.  Blood clot in your lungs. Other causes of chest pain may not be so serious. These include:  Heartburn.  Anxiety or stress.  Damage to bones or muscles in your chest.  Lung infections. Chest pain can feel like:  Pain or discomfort in your chest.  Crushing, pressure, aching, or squeezing pain.  Burning or tingling.  Dull or sharp pain that is worse when you move, cough, or take a deep breath.  Pain or discomfort that is also felt in your back, neck, jaw, shoulder, or arm, or pain that spreads to any of these areas. It is hard to know whether your pain is caused by something that is serious or something that is not so serious. So it is important to see your doctor right away if you have chest pain. Follow these instructions at home: Medicines  Take over-the-counter and prescription medicines only as told by your doctor.  If you were prescribed an antibiotic medicine, take it as told by your doctor. Do not stop taking the antibiotic even if you start to feel better. Lifestyle   Rest as told by your doctor.  Do not use any products that contain nicotine or tobacco, such as cigarettes, e-cigarettes, and chewing tobacco. If you need help quitting, ask your doctor.  Do not drink alcohol.  Make lifestyle changes as told by your doctor. These may include: ? Getting regular exercise. Ask your doctor what activities are safe for you. ? Eating a heart-healthy diet. A diet and nutrition specialist (dietitian) can help you to learn healthy  eating options. ? Staying at a healthy weight. ? Treating diabetes or high blood pressure, if needed. ? Lowering your stress. Activities such as yoga and relaxation techniques can help. General instructions  Pay attention to any changes in your symptoms. Tell your doctor about them or any new symptoms.  Avoid any activities that cause chest pain.  Keep all follow-up visits as told by your doctor. This is important. You may need more testing if your chest pain does not go away. Contact a doctor if:  Your chest pain does not go away.  You feel depressed.  You have a fever. Get help right away if:  Your chest pain is worse.  You have a cough that gets worse, or you cough up blood.  You have very bad (severe) pain in your belly (abdomen).  You pass out (faint).  You have either of these for no clear reason: ? Sudden chest discomfort. ? Sudden discomfort in your arms, back, neck, or jaw.  You have shortness of breath at any time.  You suddenly start to sweat, or your skin gets clammy.  You feel sick to your stomach (nauseous).  You throw up (vomit).  You suddenly feel lightheaded or dizzy.  You feel very weak or tired.  Your heart starts to beat fast, or it feels like it is skipping beats. These symptoms may be an emergency. Do not wait to see if the symptoms will go away. Get medical help right away. Call your local emergency services (911  in the [place] Do not drive yourself to the hospital. Summary  Chest pain can be caused by many different conditions. The cause may be serious and need treatment right away. If you have chest pain, see your doctor right away.  Follow your doctor's instructions for taking medicines and making lifestyle changes.  Keep all follow-up visits as told by your doctor. This includes visits for any further testing if your chest pain does not go away.  Be sure to know the signs that show that your condition has become worse. Get help right  away if you have these symptoms. This information is not intended to replace advice given to you by your health care provider. Make sure you discuss any questions you have with your health care provider. Document Released: 10/12/2007 Document Revised: 10/26/2017 Document Reviewed: 10/26/2017 Elsevier Interactive Patient Education  2019 [employer] Sinusitis, Adult Sinusitis is inflammation of your sinuses. Sinuses are hollow spaces in the bones around your face. Your sinuses are located:  Around your eyes.  In the middle of your forehead.  Behind your nose.  In your cheekbones. Mucus normally drains out of your sinuses. When your nasal tissues become inflamed or swollen, mucus can become trapped or blocked. This allows bacteria, viruses, and fungi to grow, which leads to infection. Most infections of the sinuses are caused by a virus. Sinusitis can develop quickly. It can last for up to 4 weeks (acute) or for more than 12 weeks (chronic). Sinusitis often develops after a cold. What are the causes? This condition is caused by anything that creates swelling in the sinuses or stops mucus from draining. This includes:  Allergies.  Asthma.  Infection from bacteria or viruses.  Deformities or blockages in your nose or sinuses.  Abnormal growths in the nose (nasal polyps).  Pollutants, such as chemicals or irritants in the air.  Infection from fungi (rare). What increases the risk? You are more likely to develop this condition if you:  Have a weak body defense system (immune system).  Do a lot of swimming or diving.  Overuse nasal sprays.  Smoke. What are the signs or symptoms? The main symptoms of this condition are pain and a feeling of pressure around the affected sinuses. Other symptoms include:  Stuffy nose or congestion.  Thick drainage from your nose.  Swelling and warmth over the affected sinuses.  Headache.  Upper toothache.  A cough that may get worse at  night.  Extra mucus that collects in the throat or the back of the nose (postnasal drip).  Decreased sense of smell and taste.  Fatigue.  A fever.  Sore throat.  Bad breath. How is this diagnosed? This condition is diagnosed based on:  Your symptoms.  Your medical history.  A physical exam.  Tests to find out if your condition is acute or chronic. This may include: ? Checking your nose for nasal polyps. ? Viewing your sinuses using a device that has a light (endoscope). ? Testing for allergies or bacteria. ? Imaging tests, such as an MRI or CT scan. In rare cases, a bone biopsy may be done to rule out more serious types of fungal sinus disease. How is this treated? Treatment for sinusitis depends on the cause and whether your condition is chronic or acute.  If caused by a virus, your symptoms should go away on their own within 10 days. You may be given medicines to relieve symptoms. They include: ? Medicines that shrink swollen nasal passages (topical intranasal  decongestants). ? Medicines that treat allergies (antihistamines). ? A spray that eases inflammation of the nostrils (topical intranasal corticosteroids). ? Rinses that help get rid of thick mucus in your nose (nasal saline washes).  If caused by bacteria, your health care provider may recommend waiting to see if your symptoms improve. Most bacterial infections will get better without antibiotic medicine. You may be given antibiotics if you have: ? A severe infection. ? A weak immune system.  If caused by narrow nasal passages or nasal polyps, you may need to have surgery. Follow these instructions at home: Medicines  Take, use, or apply over-the-counter and prescription medicines only as told by your health care provider. These may include nasal sprays.  If you were prescribed an antibiotic medicine, take it as told by your health care provider. Do not stop taking the antibiotic even if you start to feel  better. Hydrate and humidify   Drink enough fluid to keep your urine pale yellow. Staying hydrated will help to thin your mucus.  Use a cool mist humidifier to keep the humidity level in your home above 50%.  Inhale steam for 10-15 minutes, 3-4 times a day, or as told by your health care provider. You can do this in the bathroom while a hot shower is running.  Limit your exposure to cool or dry air. Rest  Rest as much as possible.  Sleep with your head raised (elevated).  Make sure you get enough sleep each night. General instructions   Apply a warm, moist washcloth to your face 3-4 times a day or as told by your health care provider. This will help with discomfort.  Wash your hands often with soap and water to reduce your exposure to germs. If soap and water are not available, use hand sanitizer.  Do not smoke. Avoid being around people who are smoking (secondhand smoke).  Keep all follow-up visits as told by your health care provider. This is important. Contact a health care provider if:  You have a fever.  Your symptoms get worse.  Your symptoms do not improve within 10 days. Get help right away if:  You have a severe headache.  You have persistent vomiting.  You have severe pain or swelling around your face or eyes.  You have vision problems.  You develop confusion.  Your neck is stiff.  You have trouble breathing. Summary  Sinusitis is soreness and inflammation of your sinuses. Sinuses are hollow spaces in the bones around your face.  This condition is caused by nasal tissues that become inflamed or swollen. The swelling traps or blocks the flow of mucus. This allows bacteria, viruses, and fungi to grow, which leads to infection.  If you were prescribed an antibiotic medicine, take it as told by your health care provider. Do not stop taking the antibiotic even if you start to feel better.  Keep all follow-up visits as told by your health care provider.  This is important. This information is not intended to replace advice given to you by your health care provider. Make sure you discuss any questions you have with your health care provider. Document Released: 04/25/2005 Document Revised: 09/25/2017 Document Reviewed: 09/25/2017 Elsevier Interactive Patient Education  2019 [employer].

## 2018-05-05 NOTE — Progress Notes (Signed)
Mitchell Jones is a 62 y.o. male who presents today with 2  days of cough and congestion symptoms and he has attempted treatments of over the counter medication and this has mildly improved his symptoms. Patient additionally complains of left sided chest pain when he cough and denies a cardiac history and reports that the pain is when he coughs only and not when he takes a deep breath. He denies that this pain radiates or tingles down his arm left or right. He denies pain when he is not coughing.  Review of Systems  Constitutional: Negative for chills, fever and malaise/fatigue.  HENT: Positive for congestion. Negative for ear discharge, ear pain, sinus pain and sore throat.   Eyes: Negative.   Respiratory: Positive for cough. Negative for sputum production and shortness of breath.   Cardiovascular: Negative.  Negative for chest pain.  Gastrointestinal: Negative for abdominal pain, diarrhea, nausea and vomiting.  Genitourinary: Negative for dysuria, frequency, hematuria and urgency.  Musculoskeletal: Negative for myalgias.  Skin: Negative.   Neurological: Negative for headaches.  Endo/Heme/Allergies: Negative.   Psychiatric/Behavioral: Negative.     Mitchell Jones has a current medication list which includes the following prescription(s): aspirin, atorvastatin, b complex vitamins, cholecalciferol, dextromethorphan-guaifenesin, naproxen sodium, pantoprazole, pseudoephedrine, tadalafil, albuterol, azelastine, and brompheniramine-pseudoephedrine-dm. Also has No Known Allergies.  Mitchell Jones  has no past medical history on file. Also  has no past surgical history on file.    O: Vitals:   05/05/18 1324  BP: (!) 145/75  Pulse: 92  Resp: 18  Temp: 98.5 F (36.9 C)  SpO2: 98%     Physical Exam Vitals signs reviewed.  Constitutional:      Appearance: He is well-developed. He is not toxic-appearing.  HENT:     Head: Normocephalic.     Right Ear: Hearing, tympanic membrane, ear canal and external ear  normal.     Left Ear: Hearing, tympanic membrane, ear canal and external ear normal.     Nose: Congestion and rhinorrhea present.     Right Sinus: No maxillary sinus tenderness or frontal sinus tenderness.     Left Sinus: No maxillary sinus tenderness or frontal sinus tenderness.     Mouth/Throat:     Pharynx: Uvula midline.  Neck:     Musculoskeletal: Normal range of motion and neck supple.  Cardiovascular:     Rate and Rhythm: Normal rate and regular rhythm.     Pulses: Normal pulses.     Heart sounds: Normal heart sounds.  Pulmonary:     Effort: Pulmonary effort is normal.     Breath sounds: Normal breath sounds.     Comments: Dry tight cough on exam- non productive- lungs CTA Abdominal:     General: Bowel sounds are normal.     Palpations: Abdomen is soft.  Musculoskeletal: Normal range of motion.  Lymphadenopathy:     Head:     Right side of head: No submental or submandibular adenopathy.     Left side of head: No submental or submandibular adenopathy.     Cervical: No cervical adenopathy.  Neurological:     Mental Status: He is alert and oriented to person, place, and time.    A: 1. Cough   2. Acute non-recurrent sinusitis, unspecified location   3. Chest pain, unspecified type    P: 1. Cough Overall unremarkable exam- frequent tight cough during physical exam. Patient complained of chest pain during exam that he insists is related to a forceful cough- he was advised to  seek care at the ED/Urgent care- he did verbally agree to seek further evaluation.  - brompheniramine-pseudoephedrine-DM 30-2-10 MG/5ML syrup; Take 10 mLs by mouth 3 (three) times daily as needed. - albuterol (PROVENTIL HFA;VENTOLIN HFA) 108 (90 Base) MCG/ACT inhaler; Inhale 2 puffs into the lungs every 6 (six) hours as needed for wheezing or shortness of breath.  2. Acute non-recurrent sinusitis, unspecified location - brompheniramine-pseudoephedrine-DM 30-2-10 MG/5ML syrup; Take 10 mLs by mouth 3  (three) times daily as needed. - azelastine (ASTELIN) 0.1 % nasal spray; Place 1 spray into both nostrils 2 (two) times daily. Use in each nostril as directed  3. Chest pain, unspecified type Seek care in ED/Urgent care discussed with patient the inability to rule out a cardiac or more serious respiratory conditions like PE or PNU and he was advised to seek care in urgent care or ED today. He reports that he would calls and speak with his wife and follow up as advised. Overall mild symptoms and stable vital signs on exam suspect viral URI that would respond to consistent use of medication. Will follow up with patient in 48 hours and monitor my chart to assess condition progression. Other orders - b complex vitamins capsule; Take by mouth. - naproxen sodium (ALEVE) 220 MG tablet; Take by mouth. - pseudoephedrine (SUDAFED) 60 MG tablet; Take 60 mg by mouth every 4 (four) hours as needed for congestion. - dextromethorphan-guaiFENesin (MUCINEX DM) 30-600 MG 12hr tablet; Take 1 tablet by mouth 2 (two) times daily.  Discussed with patient exam findings, suspected diagnosis etiology and  reviewed recommended treatment plan and follow up, including complications and indications for urgent medical follow up and evaluation. Medications including use and indications reviewed with patient. Patient provided relevant patient education on diagnosis and/or relevant related condition that were discussed and reviewed with patient at discharge. Patient verbalized understanding of information provided and agrees with plan of care (POC), all questions answered.

## 2018-05-25 DIAGNOSIS — Z23 Encounter for immunization: Secondary | ICD-10-CM | POA: Diagnosis not present

## 2018-05-25 DIAGNOSIS — Z1389 Encounter for screening for other disorder: Secondary | ICD-10-CM | POA: Diagnosis not present

## 2018-05-25 DIAGNOSIS — E8881 Metabolic syndrome: Secondary | ICD-10-CM | POA: Diagnosis not present

## 2018-05-25 DIAGNOSIS — E7849 Other hyperlipidemia: Secondary | ICD-10-CM | POA: Diagnosis not present

## 2018-05-25 DIAGNOSIS — R7301 Impaired fasting glucose: Secondary | ICD-10-CM | POA: Diagnosis not present

## 2018-05-25 DIAGNOSIS — Z0000 Encounter for general adult medical examination without abnormal findings: Secondary | ICD-10-CM | POA: Diagnosis not present

## 2018-05-25 DIAGNOSIS — F5221 Male erectile disorder: Secondary | ICD-10-CM | POA: Diagnosis not present

## 2018-05-25 DIAGNOSIS — Z8719 Personal history of other diseases of the digestive system: Secondary | ICD-10-CM | POA: Diagnosis not present

## 2018-05-25 DIAGNOSIS — K219 Gastro-esophageal reflux disease without esophagitis: Secondary | ICD-10-CM | POA: Diagnosis not present

## 2018-05-25 DIAGNOSIS — R030 Elevated blood-pressure reading, without diagnosis of hypertension: Secondary | ICD-10-CM | POA: Diagnosis not present

## 2018-05-25 DIAGNOSIS — E668 Other obesity: Secondary | ICD-10-CM | POA: Diagnosis not present

## 2018-06-11 DIAGNOSIS — H5203 Hypermetropia, bilateral: Secondary | ICD-10-CM | POA: Diagnosis not present

## 2018-06-11 MED FILL — FLUOROURACIL 5 % CREA: 5 | 14 days supply | Qty: 40 | Fill #0

## 2018-06-25 MED FILL — TADALAFIL 20 MG TABS: 20 | 30 days supply | Qty: 6 | Fill #4

## 2018-07-11 DIAGNOSIS — R030 Elevated blood-pressure reading, without diagnosis of hypertension: Secondary | ICD-10-CM | POA: Diagnosis not present

## 2018-07-11 DIAGNOSIS — Z6836 Body mass index (BMI) 36.0-36.9, adult: Secondary | ICD-10-CM | POA: Diagnosis not present

## 2018-07-16 DIAGNOSIS — R030 Elevated blood-pressure reading, without diagnosis of hypertension: Secondary | ICD-10-CM | POA: Diagnosis not present

## 2018-07-19 MED FILL — OLMESARTAN MEDOXOMIL 20 MG: 20 | 30 days supply | Qty: 30 | Fill #0

## 2018-07-19 MED FILL — FLUOCINONIDE 0.05% CREAM: 0.05 | 20 days supply | Qty: 30 | Fill #0

## 2018-07-26 MED FILL — TADALAFIL 20 MG TABS: 20 | 30 days supply | Qty: 6 | Fill #5

## 2018-07-26 MED FILL — PANTOPRAZOLE SOD DR 40 MG T: 40 | 90 days supply | Qty: 90 | Fill #1

## 2018-08-02 DIAGNOSIS — M79676 Pain in unspecified toe(s): Secondary | ICD-10-CM

## 2018-08-03 MED FILL — ATORVASTATIN 20 MG TABLET: 20 | 30 days supply | Qty: 30 | Fill #2

## 2018-08-13 MED FILL — OLMESARTAN MEDOXOMIL 20 MG: 20 | 30 days supply | Qty: 30 | Fill #1

## 2018-08-21 MED FILL — TADALAFIL 20 MG TABS: 20 | 30 days supply | Qty: 6 | Fill #6

## 2018-09-06 MED FILL — ATORVASTATIN 20 MG TABLET: 20 | 90 days supply | Qty: 90 | Fill #0

## 2018-09-06 MED FILL — OLMESARTAN MEDOXOMIL 20 MG: 20 | 30 days supply | Qty: 30 | Fill #2

## 2018-09-07 MED FILL — SSS 10-5 CREAM: 10-5 | 14 days supply | Qty: 28 | Fill #0

## 2018-10-08 DIAGNOSIS — I129 Hypertensive chronic kidney disease with stage 1 through stage 4 chronic kidney disease, or unspecified chronic kidney disease: Secondary | ICD-10-CM | POA: Diagnosis not present

## 2018-10-08 DIAGNOSIS — N182 Chronic kidney disease, stage 2 (mild): Secondary | ICD-10-CM | POA: Diagnosis not present

## 2018-10-11 MED FILL — OLMESARTAN MEDOXOMIL 20 MG: 20 | 30 days supply | Qty: 30 | Fill #3

## 2018-10-17 MED FILL — TADALAFIL 20 MG TABS: 20 | 30 days supply | Qty: 6 | Fill #7

## 2018-10-30 MED FILL — PANTOPRAZOLE SOD DR 40 MG T: 40 | 90 days supply | Qty: 90 | Fill #0

## 2018-10-31 DIAGNOSIS — D1801 Hemangioma of skin and subcutaneous tissue: Secondary | ICD-10-CM | POA: Diagnosis not present

## 2018-10-31 DIAGNOSIS — Z85828 Personal history of other malignant neoplasm of skin: Secondary | ICD-10-CM | POA: Diagnosis not present

## 2018-10-31 DIAGNOSIS — L821 Other seborrheic keratosis: Secondary | ICD-10-CM | POA: Diagnosis not present

## 2018-10-31 DIAGNOSIS — L570 Actinic keratosis: Secondary | ICD-10-CM | POA: Diagnosis not present

## 2018-11-15 MED FILL — OLMESARTAN MEDOXOMIL 20 MG: 20 | 30 days supply | Qty: 30 | Fill #4

## 2018-11-17 MED FILL — ATORVASTATIN 20 MG TABLET: 20 | 90 days supply | Qty: 90 | Fill #0

## 2018-12-22 MED FILL — OLMESARTAN MEDOXOMIL 20 MG: 20 | 30 days supply | Qty: 30 | Fill #5

## 2018-12-25 MED FILL — TADALAFIL 20 MG TABS: 20 | 90 days supply | Qty: 18 | Fill #0

## 2019-01-07 DIAGNOSIS — I10 Essential (primary) hypertension: Secondary | ICD-10-CM | POA: Diagnosis not present

## 2019-01-21 MED FILL — OLMESARTAN MEDOXOMIL 20 MG: 20 | 30 days supply | Qty: 30 | Fill #6

## 2019-01-28 MED FILL — PANTOPRAZOLE SOD DR 40 MG T: 40 | 90 days supply | Qty: 90 | Fill #1

## 2019-02-16 DIAGNOSIS — Z23 Encounter for immunization: Secondary | ICD-10-CM | POA: Diagnosis not present

## 2019-02-22 MED FILL — OLMESARTAN MEDOXOMIL 20 MG: 20 | 30 days supply | Qty: 30 | Fill #0

## 2019-03-11 MED FILL — ATORVASTATIN 20 MG TABLET: 20 | 90 days supply | Qty: 90 | Fill #1

## 2019-03-25 MED FILL — OLMESARTAN MEDOXOMIL 20 MG: 20 | 30 days supply | Qty: 30 | Fill #1

## 2019-03-26 ENCOUNTER — Other Ambulatory Visit: Payer: Self-pay

## 2019-03-26 DIAGNOSIS — Z20822 Contact with and (suspected) exposure to covid-19: Secondary | ICD-10-CM

## 2019-03-27 LAB — NOVEL CORONAVIRUS, NAA: SARS-CoV-2, NAA: NOT DETECTED

## 2019-04-10 DIAGNOSIS — L570 Actinic keratosis: Secondary | ICD-10-CM | POA: Diagnosis not present

## 2019-04-10 DIAGNOSIS — L821 Other seborrheic keratosis: Secondary | ICD-10-CM | POA: Diagnosis not present

## 2019-04-10 DIAGNOSIS — D1801 Hemangioma of skin and subcutaneous tissue: Secondary | ICD-10-CM | POA: Diagnosis not present

## 2019-04-10 DIAGNOSIS — Z85828 Personal history of other malignant neoplasm of skin: Secondary | ICD-10-CM | POA: Diagnosis not present

## 2019-04-10 DIAGNOSIS — D2271 Melanocytic nevi of right lower limb, including hip: Secondary | ICD-10-CM | POA: Diagnosis not present

## 2019-04-10 DIAGNOSIS — D2272 Melanocytic nevi of left lower limb, including hip: Secondary | ICD-10-CM | POA: Diagnosis not present

## 2019-04-22 MED FILL — TADALAFIL 20 MG TABS: 20 | 30 days supply | Qty: 6 | Fill #1

## 2019-04-29 MED FILL — OLMESARTAN MEDOXOMIL 20 MG: 20 | 30 days supply | Qty: 30 | Fill #2

## 2019-04-30 MED FILL — PANTOPRAZOLE SOD DR 40 MG T: 40 | 90 days supply | Qty: 90 | Fill #0

## 2019-05-31 ENCOUNTER — Other Ambulatory Visit: Payer: Self-pay

## 2019-05-31 DIAGNOSIS — Z0000 Encounter for general adult medical examination without abnormal findings: Secondary | ICD-10-CM | POA: Diagnosis not present

## 2019-05-31 DIAGNOSIS — R82998 Other abnormal findings in urine: Secondary | ICD-10-CM | POA: Diagnosis not present

## 2019-05-31 DIAGNOSIS — I10 Essential (primary) hypertension: Secondary | ICD-10-CM | POA: Diagnosis not present

## 2019-05-31 DIAGNOSIS — E7849 Other hyperlipidemia: Secondary | ICD-10-CM | POA: Diagnosis not present

## 2019-05-31 DIAGNOSIS — R7301 Impaired fasting glucose: Secondary | ICD-10-CM | POA: Diagnosis not present

## 2019-05-31 DIAGNOSIS — Z125 Encounter for screening for malignant neoplasm of prostate: Secondary | ICD-10-CM | POA: Diagnosis not present

## 2019-06-01 ENCOUNTER — Ambulatory Visit: Payer: 59 | Attending: Internal Medicine

## 2019-06-01 DIAGNOSIS — Z23 Encounter for immunization: Secondary | ICD-10-CM | POA: Insufficient documentation

## 2019-06-01 NOTE — Progress Notes (Signed)
   Covid-19 Vaccination Clinic  Name:  Mitchell Jones    MRN: [id number] DOB: 06/18/1955  06/01/2019  [name] was observed post Covid-19 immunization for 15 minutes without incidence. He was provided with Vaccine Information Sheet and instruction to access the V-Safe system.   [name] was instructed to call 911 with any severe reactions post vaccine: Marland Kitchen Difficulty breathing  . Swelling of your face and throat  . A fast heartbeat  . A bad rash all over your body  . Dizziness and weakness    Immunizations Administered    Name Date Dose VIS Date Route   Pfizer COVID-19 Vaccine 06/01/2019 10:37 AM 0.3 mL 04/19/2019 Intramuscular   Manufacturer: [place]   Lot: [id number]   [place]: [id number]

## 2019-06-04 DIAGNOSIS — Z1212 Encounter for screening for malignant neoplasm of rectum: Secondary | ICD-10-CM | POA: Diagnosis not present

## 2019-06-04 MED FILL — OLMESARTAN MEDOXOMIL 20 MG: 20 | 30 days supply | Qty: 30 | Fill #3

## 2019-06-07 DIAGNOSIS — F5221 Male erectile disorder: Secondary | ICD-10-CM | POA: Diagnosis not present

## 2019-06-07 DIAGNOSIS — Z1339 Encounter for screening examination for other mental health and behavioral disorders: Secondary | ICD-10-CM | POA: Diagnosis not present

## 2019-06-07 DIAGNOSIS — R7301 Impaired fasting glucose: Secondary | ICD-10-CM | POA: Diagnosis not present

## 2019-06-07 DIAGNOSIS — Z1331 Encounter for screening for depression: Secondary | ICD-10-CM | POA: Diagnosis not present

## 2019-06-07 DIAGNOSIS — E669 Obesity, unspecified: Secondary | ICD-10-CM | POA: Diagnosis not present

## 2019-06-07 DIAGNOSIS — Z0000 Encounter for general adult medical examination without abnormal findings: Secondary | ICD-10-CM | POA: Diagnosis not present

## 2019-06-07 DIAGNOSIS — E8881 Metabolic syndrome: Secondary | ICD-10-CM | POA: Diagnosis not present

## 2019-06-07 DIAGNOSIS — Z8719 Personal history of other diseases of the digestive system: Secondary | ICD-10-CM | POA: Diagnosis not present

## 2019-06-07 DIAGNOSIS — E785 Hyperlipidemia, unspecified: Secondary | ICD-10-CM | POA: Diagnosis not present

## 2019-06-07 DIAGNOSIS — K219 Gastro-esophageal reflux disease without esophagitis: Secondary | ICD-10-CM | POA: Diagnosis not present

## 2019-06-07 DIAGNOSIS — I10 Essential (primary) hypertension: Secondary | ICD-10-CM | POA: Diagnosis not present

## 2019-06-10 ENCOUNTER — Other Ambulatory Visit: Payer: Self-pay | Admitting: Internal Medicine

## 2019-06-10 DIAGNOSIS — Z0000 Encounter for general adult medical examination without abnormal findings: Secondary | ICD-10-CM

## 2019-06-14 ENCOUNTER — Ambulatory Visit
Admission: RE | Admit: 2019-06-14 | Discharge: 2019-06-14 | Disposition: A | Discharge status: Home / Self Care | Payer: 59 | Source: Ambulatory Visit | Attending: Internal Medicine | Admitting: Internal Medicine

## 2019-06-14 DIAGNOSIS — Z0000 Encounter for general adult medical examination without abnormal findings: Secondary | ICD-10-CM

## 2019-06-14 DIAGNOSIS — E785 Hyperlipidemia, unspecified: Secondary | ICD-10-CM | POA: Diagnosis not present

## 2019-06-17 DIAGNOSIS — H5203 Hypermetropia, bilateral: Secondary | ICD-10-CM | POA: Diagnosis not present

## 2019-06-17 DIAGNOSIS — H524 Presbyopia: Secondary | ICD-10-CM | POA: Diagnosis not present

## 2019-06-19 MED FILL — ATORVASTATIN 20 MG TABLET: 20 | 90 days supply | Qty: 90 | Fill #2

## 2019-06-21 ENCOUNTER — Ambulatory Visit: Payer: 59 | Attending: Internal Medicine

## 2019-06-21 DIAGNOSIS — Z23 Encounter for immunization: Secondary | ICD-10-CM | POA: Insufficient documentation

## 2019-06-21 NOTE — Progress Notes (Signed)
   Covid-19 Vaccination Clinic  Name:  Mitchell Jones    MRN: [id number] DOB: 1955-11-01  06/21/2019  [name] was observed post Covid-19 immunization for 15 minutes without incidence. He was provided with Vaccine Information Sheet and instruction to access the V-Safe system.   [name] was instructed to call 911 with any severe reactions post vaccine: Marland Kitchen Difficulty breathing  . Swelling of your face and throat  . A fast heartbeat  . A bad rash all over your body  . Dizziness and weakness    Immunizations Administered    Name Date Dose VIS Date Route   Pfizer COVID-19 Vaccine 06/21/2019  8:27 AM 0.3 mL 04/19/2019 Intramuscular   Manufacturer: [place]   Lot: [id number]   [place]: [id number]

## 2019-06-27 ENCOUNTER — Ambulatory Visit: Payer: 59

## 2019-06-28 MED FILL — TADALAFIL 20 MG TABS: 20 | 30 days supply | Qty: 6 | Fill #2

## 2019-07-09 MED FILL — OLMESARTAN MEDOXOMIL 20 MG: 20 | 30 days supply | Qty: 30 | Fill #4

## 2019-07-29 MED FILL — TADALAFIL 20 MG TABS: 20 | 30 days supply | Qty: 6 | Fill #3

## 2019-07-30 MED FILL — PANTOPRAZOLE SOD DR 40 MG T: 40 | 90 days supply | Qty: 90 | Fill #1

## 2019-08-02 MED FILL — SSS 10-5 CREAM: 10-5 | 14 days supply | Qty: 28 | Fill #1

## 2019-08-02 MED FILL — OLMESARTAN MEDOXOMIL 20 MG: 20 | 30 days supply | Qty: 30 | Fill #5

## 2019-09-09 ENCOUNTER — Other Ambulatory Visit (HOSPITAL_COMMUNITY): Payer: Self-pay | Admitting: Internal Medicine

## 2019-09-09 MED FILL — OLMESARTAN MEDOXOMIL 20 MG: 20 | 30 days supply | Qty: 30 | Fill #0

## 2019-09-24 ENCOUNTER — Other Ambulatory Visit (HOSPITAL_COMMUNITY): Payer: Self-pay | Admitting: Internal Medicine

## 2019-09-24 MED FILL — ATORVASTATIN 20 MG TABLET: 20 | 90 days supply | Qty: 90 | Fill #0

## 2019-10-03 DIAGNOSIS — L239 Allergic contact dermatitis, unspecified cause: Secondary | ICD-10-CM | POA: Diagnosis not present

## 2019-10-03 DIAGNOSIS — Z85828 Personal history of other malignant neoplasm of skin: Secondary | ICD-10-CM | POA: Diagnosis not present

## 2019-10-03 MED FILL — BETAMETHASONE DP 0.05% CRM: 0.05 | 15 days supply | Qty: 45 | Fill #0

## 2019-10-08 DIAGNOSIS — M79676 Pain in unspecified toe(s): Secondary | ICD-10-CM

## 2019-10-08 MED FILL — OLMESARTAN MEDOXOMIL 20 MG: 20 | 30 days supply | Qty: 30 | Fill #1

## 2019-10-08 MED FILL — TADALAFIL 20 MG TABS: 20 | 30 days supply | Qty: 6 | Fill #4

## 2019-11-05 ENCOUNTER — Other Ambulatory Visit (HOSPITAL_COMMUNITY): Payer: Self-pay | Admitting: Internal Medicine

## 2019-11-05 MED FILL — PANTOPRAZOLE SOD DR 40 MG T: 40 | 90 days supply | Qty: 90 | Fill #0

## 2019-11-08 MED FILL — TADALAFIL 20 MG TABS: 20 | 30 days supply | Qty: 6 | Fill #5

## 2019-11-13 MED FILL — OLMESARTAN MEDOXOMIL 20 MG: 20 | 30 days supply | Qty: 30 | Fill #2

## 2019-12-16 MED FILL — OLMESARTAN MEDOXOMIL 20 MG: 20 | 30 days supply | Qty: 30 | Fill #3

## 2019-12-24 DIAGNOSIS — Z85828 Personal history of other malignant neoplasm of skin: Secondary | ICD-10-CM | POA: Diagnosis not present

## 2019-12-24 DIAGNOSIS — L821 Other seborrheic keratosis: Secondary | ICD-10-CM | POA: Diagnosis not present

## 2019-12-24 DIAGNOSIS — L570 Actinic keratosis: Secondary | ICD-10-CM | POA: Diagnosis not present

## 2019-12-24 DIAGNOSIS — L814 Other melanin hyperpigmentation: Secondary | ICD-10-CM | POA: Diagnosis not present

## 2019-12-24 DIAGNOSIS — D1801 Hemangioma of skin and subcutaneous tissue: Secondary | ICD-10-CM | POA: Diagnosis not present

## 2019-12-24 DIAGNOSIS — B353 Tinea pedis: Secondary | ICD-10-CM | POA: Diagnosis not present

## 2019-12-26 MED FILL — ATORVASTATIN CALCIUM 20 MG: 20 | 90 days supply | Qty: 90 | Fill #1

## 2019-12-31 ENCOUNTER — Other Ambulatory Visit (HOSPITAL_COMMUNITY): Payer: Self-pay | Admitting: Urology

## 2020-01-03 MED FILL — SILODOSIN 8 MG CAPS: 8 | 90 days supply | Qty: 90 | Fill #0

## 2020-01-14 MED FILL — OLMESARTAN MEDOXOMIL 20 MG: 20 | 90 days supply | Qty: 90 | Fill #0

## 2020-01-27 DIAGNOSIS — M79641 Pain in right hand: Secondary | ICD-10-CM | POA: Diagnosis not present

## 2020-01-27 DIAGNOSIS — M79644 Pain in right finger(s): Secondary | ICD-10-CM | POA: Diagnosis not present

## 2020-01-27 DIAGNOSIS — G5601 Carpal tunnel syndrome, right upper limb: Secondary | ICD-10-CM | POA: Diagnosis not present

## 2020-02-14 MED FILL — PANTOPRAZOLE SOD DR 40 MG T: 40 | 90 days supply | Qty: 90 | Fill #1

## 2020-02-14 MED FILL — TADALAFIL 20 MG TABS: 20 | 90 days supply | Qty: 18 | Fill #0

## 2020-02-15 DIAGNOSIS — Z23 Encounter for immunization: Secondary | ICD-10-CM | POA: Diagnosis not present

## 2020-02-17 ENCOUNTER — Other Ambulatory Visit (HOSPITAL_COMMUNITY): Payer: Self-pay | Admitting: Internal Medicine

## 2020-02-17 MED FILL — PNEUMOVAX 23 SYRINGE: 25 | 1 days supply | Qty: 1 | Fill #0

## 2020-03-24 MED FILL — ATORVASTATIN CALCIUM 20 MG: 20 | 90 days supply | Qty: 90 | Fill #0

## 2020-03-27 ENCOUNTER — Other Ambulatory Visit (HOSPITAL_COMMUNITY): Payer: Self-pay | Admitting: Orthopedic Surgery

## 2020-03-27 DIAGNOSIS — M6289 Other specified disorders of muscle: Secondary | ICD-10-CM | POA: Diagnosis not present

## 2020-03-27 DIAGNOSIS — G5601 Carpal tunnel syndrome, right upper limb: Secondary | ICD-10-CM | POA: Diagnosis not present

## 2020-03-27 MED FILL — METHOCARBAMOL 500 MG TABS: 500 | 10 days supply | Qty: 40 | Fill #0

## 2020-03-27 MED FILL — ONDANSETRON ODT 8 MG TABLET: 8 | 5 days supply | Qty: 15 | Fill #0

## 2020-03-27 MED FILL — CEPHALEXIN 500 MG CAPSULE: 500 | 7 days supply | Qty: 28 | Fill #0

## 2020-03-27 MED FILL — oxyCODONE HCL 5 MG TABS: 5 | 7 days supply | Qty: 42 | Fill #0

## 2020-04-09 DIAGNOSIS — G5601 Carpal tunnel syndrome, right upper limb: Secondary | ICD-10-CM | POA: Diagnosis not present

## 2020-04-09 DIAGNOSIS — Z4789 Encounter for other orthopedic aftercare: Secondary | ICD-10-CM | POA: Diagnosis not present

## 2020-04-09 DIAGNOSIS — M79641 Pain in right hand: Secondary | ICD-10-CM | POA: Diagnosis not present

## 2020-04-21 ENCOUNTER — Other Ambulatory Visit (HOSPITAL_COMMUNITY): Payer: Self-pay | Admitting: Gastroenterology

## 2020-04-22 MED FILL — OLMESARTAN MEDOXOMIL 20 MG: 20 | 90 days supply | Qty: 90 | Fill #1

## 2020-04-23 DIAGNOSIS — M25641 Stiffness of right hand, not elsewhere classified: Secondary | ICD-10-CM | POA: Diagnosis not present

## 2020-04-30 DIAGNOSIS — M25631 Stiffness of right wrist, not elsewhere classified: Secondary | ICD-10-CM | POA: Diagnosis not present

## 2020-05-07 DIAGNOSIS — M25641 Stiffness of right hand, not elsewhere classified: Secondary | ICD-10-CM | POA: Diagnosis not present

## 2020-05-12 ENCOUNTER — Other Ambulatory Visit (HOSPITAL_COMMUNITY): Payer: Self-pay | Admitting: Dermatology

## 2020-05-12 MED FILL — PANTOPRAZOLE SOD DR 40 MG T: 40 | 90 days supply | Qty: 90 | Fill #2

## 2020-05-15 DIAGNOSIS — M25641 Stiffness of right hand, not elsewhere classified: Secondary | ICD-10-CM | POA: Diagnosis not present

## 2020-06-01 DIAGNOSIS — R7301 Impaired fasting glucose: Secondary | ICD-10-CM | POA: Diagnosis not present

## 2020-06-01 DIAGNOSIS — Z0000 Encounter for general adult medical examination without abnormal findings: Secondary | ICD-10-CM | POA: Diagnosis not present

## 2020-06-01 DIAGNOSIS — Z125 Encounter for screening for malignant neoplasm of prostate: Secondary | ICD-10-CM | POA: Diagnosis not present

## 2020-06-01 DIAGNOSIS — E785 Hyperlipidemia, unspecified: Secondary | ICD-10-CM | POA: Diagnosis not present

## 2020-06-02 DIAGNOSIS — M25641 Stiffness of right hand, not elsewhere classified: Secondary | ICD-10-CM | POA: Diagnosis not present

## 2020-06-04 DIAGNOSIS — R82998 Other abnormal findings in urine: Secondary | ICD-10-CM | POA: Diagnosis not present

## 2020-06-08 DIAGNOSIS — K219 Gastro-esophageal reflux disease without esophagitis: Secondary | ICD-10-CM | POA: Diagnosis not present

## 2020-06-08 DIAGNOSIS — I10 Essential (primary) hypertension: Secondary | ICD-10-CM | POA: Diagnosis not present

## 2020-06-08 DIAGNOSIS — E785 Hyperlipidemia, unspecified: Secondary | ICD-10-CM | POA: Diagnosis not present

## 2020-06-08 DIAGNOSIS — F5221 Male erectile disorder: Secondary | ICD-10-CM | POA: Diagnosis not present

## 2020-06-08 DIAGNOSIS — E669 Obesity, unspecified: Secondary | ICD-10-CM | POA: Diagnosis not present

## 2020-06-08 DIAGNOSIS — Z86010 Personal history of colonic polyps: Secondary | ICD-10-CM | POA: Diagnosis not present

## 2020-06-08 DIAGNOSIS — E8881 Metabolic syndrome: Secondary | ICD-10-CM | POA: Diagnosis not present

## 2020-06-08 DIAGNOSIS — Z0000 Encounter for general adult medical examination without abnormal findings: Secondary | ICD-10-CM | POA: Diagnosis not present

## 2020-06-08 DIAGNOSIS — R7301 Impaired fasting glucose: Secondary | ICD-10-CM | POA: Diagnosis not present

## 2020-06-16 DIAGNOSIS — M25641 Stiffness of right hand, not elsewhere classified: Secondary | ICD-10-CM | POA: Diagnosis not present

## 2020-06-17 DIAGNOSIS — H5203 Hypermetropia, bilateral: Secondary | ICD-10-CM | POA: Diagnosis not present

## 2020-06-17 DIAGNOSIS — H524 Presbyopia: Secondary | ICD-10-CM | POA: Diagnosis not present

## 2020-06-19 MED FILL — TADALAFIL 20 MG TABS: 20 | 90 days supply | Qty: 18 | Fill #1

## 2020-06-19 MED FILL — ATORVASTATIN CALCIUM 20 MG: 20 | 90 days supply | Qty: 90 | Fill #1

## 2020-06-23 DIAGNOSIS — M25641 Stiffness of right hand, not elsewhere classified: Secondary | ICD-10-CM | POA: Diagnosis not present

## 2020-06-24 DIAGNOSIS — Z01812 Encounter for preprocedural laboratory examination: Secondary | ICD-10-CM | POA: Diagnosis not present

## 2020-06-24 MED FILL — PEG-3350 SOLUTION: 420 | 1 days supply | Qty: 4000 | Fill #0

## 2020-06-25 DIAGNOSIS — L570 Actinic keratosis: Secondary | ICD-10-CM | POA: Diagnosis not present

## 2020-06-25 DIAGNOSIS — D1801 Hemangioma of skin and subcutaneous tissue: Secondary | ICD-10-CM | POA: Diagnosis not present

## 2020-06-25 DIAGNOSIS — L821 Other seborrheic keratosis: Secondary | ICD-10-CM | POA: Diagnosis not present

## 2020-06-25 DIAGNOSIS — Z85828 Personal history of other malignant neoplasm of skin: Secondary | ICD-10-CM | POA: Diagnosis not present

## 2020-06-29 DIAGNOSIS — K635 Polyp of colon: Secondary | ICD-10-CM | POA: Diagnosis not present

## 2020-06-29 DIAGNOSIS — K5730 Diverticulosis of large intestine without perforation or abscess without bleeding: Secondary | ICD-10-CM | POA: Diagnosis not present

## 2020-06-29 DIAGNOSIS — Z86010 Personal history of colonic polyps: Secondary | ICD-10-CM | POA: Diagnosis not present

## 2020-06-30 DIAGNOSIS — M25641 Stiffness of right hand, not elsewhere classified: Secondary | ICD-10-CM | POA: Diagnosis not present

## 2020-07-07 DIAGNOSIS — M25641 Stiffness of right hand, not elsewhere classified: Secondary | ICD-10-CM | POA: Diagnosis not present

## 2020-07-21 DIAGNOSIS — M25641 Stiffness of right hand, not elsewhere classified: Secondary | ICD-10-CM | POA: Diagnosis not present

## 2020-07-30 MED FILL — OLMESARTAN MEDOXOMIL 20 MG: 20 | 90 days supply | Qty: 90 | Fill #2

## 2020-08-04 DIAGNOSIS — M25641 Stiffness of right hand, not elsewhere classified: Secondary | ICD-10-CM | POA: Diagnosis not present

## 2020-08-05 ENCOUNTER — Other Ambulatory Visit (HOSPITAL_COMMUNITY): Payer: Self-pay | Admitting: Internal Medicine

## 2020-08-05 MED FILL — PANTOPRAZOLE SOD DR 40 MG T: 40 | 90 days supply | Qty: 90 | Fill #0

## 2020-08-18 DIAGNOSIS — M25641 Stiffness of right hand, not elsewhere classified: Secondary | ICD-10-CM | POA: Diagnosis not present

## 2020-09-01 DIAGNOSIS — M25641 Stiffness of right hand, not elsewhere classified: Secondary | ICD-10-CM | POA: Diagnosis not present

## 2020-09-04 ENCOUNTER — Ambulatory Visit: Payer: 59 | Attending: Internal Medicine

## 2020-09-04 ENCOUNTER — Other Ambulatory Visit (HOSPITAL_BASED_OUTPATIENT_CLINIC_OR_DEPARTMENT_OTHER): Payer: Self-pay

## 2020-09-04 ENCOUNTER — Other Ambulatory Visit: Payer: Self-pay

## 2020-09-04 DIAGNOSIS — Z23 Encounter for immunization: Secondary | ICD-10-CM

## 2020-09-04 MED ORDER — PFIZER-BIONT COVID-19 VAC-TRIS 30 MCG/0.3ML IM SUSP
INTRAMUSCULAR | 0 refills | Status: AC
  Filled 2020-09-04: qty 0.3, 1d supply, fill #0

## 2020-09-04 NOTE — Progress Notes (Signed)
   Covid-19 Vaccination Clinic  Name:  Mitchell Jones    MRN: [id number] DOB: Mar 02, 1956  09/04/2020  Mitchell Jones was observed post Covid-19 immunization for 15 minutes without incident. He was provided with Vaccine Information Sheet and instruction to access the V-Safe system.   Mitchell Jones was instructed to call 911 with any severe reactions post vaccine: Marland Kitchen Difficulty breathing  . Swelling of face and throat  . A fast heartbeat  . A bad rash all over body  . Dizziness and weakness   Immunizations Administered    Name Date Dose VIS Date Route   PFIZER Comrnaty(Gray TOP) Covid-19 Vaccine 09/04/2020  1:15 PM 0.3 mL 04/16/2020 Intramuscular   Manufacturer: [place]   Lot: [id number]   [place]: [phone]

## 2020-09-15 DIAGNOSIS — M25641 Stiffness of right hand, not elsewhere classified: Secondary | ICD-10-CM | POA: Diagnosis not present

## 2020-09-22 ENCOUNTER — Other Ambulatory Visit (HOSPITAL_COMMUNITY): Payer: Self-pay

## 2020-09-22 MED FILL — Atorvastatin Calcium Tab 20 MG (Base Equivalent): ORAL | 90 days supply | Qty: 90 | Fill #0 | Status: AC

## 2020-09-25 IMAGING — US US AORTA
1 series · 14 of 25 positions shown · non-contrast
Comparison: None.

CLINICAL DATA: Evaluate for abdominal aortic aneurysm. History of
hypertension and hyperlipidemia.

EXAM:
ULTRASOUND OF ABDOMINAL AORTA
TECHNIQUE: Ultrasound examination of the abdominal aorta and proximal common
iliac arteries was performed to evaluate for aneurysm. Additional
color and Doppler images of the distal aorta were obtained to
document patency.

[Series 1: us aorta · 0.31mm/px · 14 of 25 slices shown]
[im 1/25]
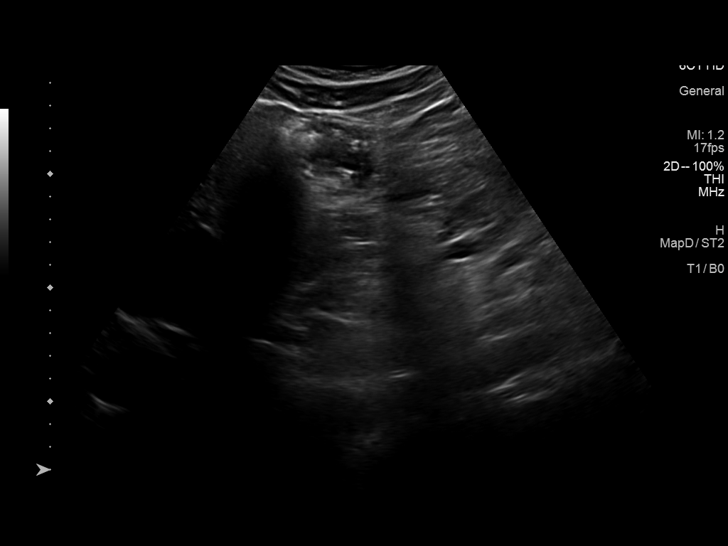
[im 3/25]
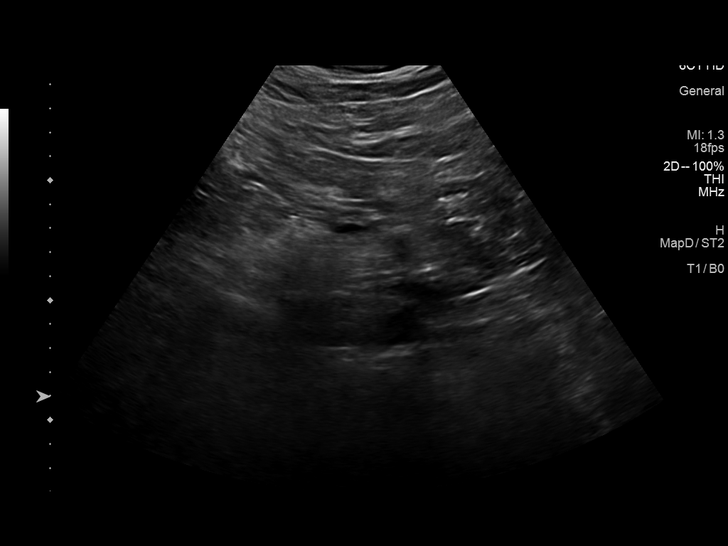
[im 5/25]
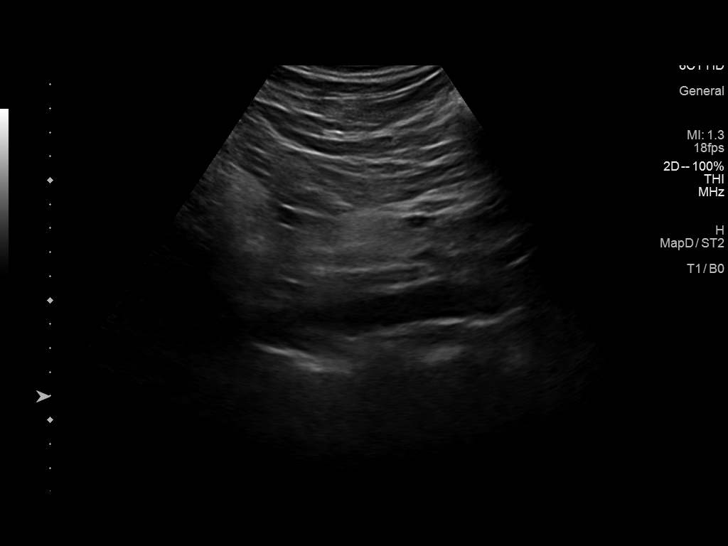
[im 7/25]
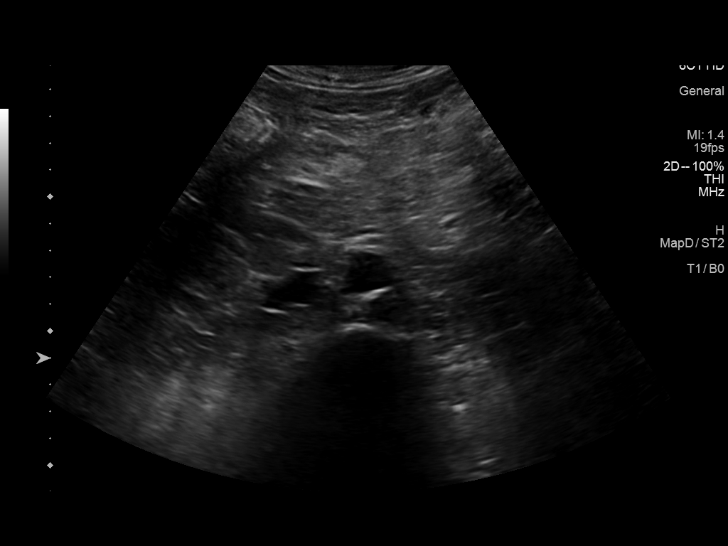
[im 9/25]
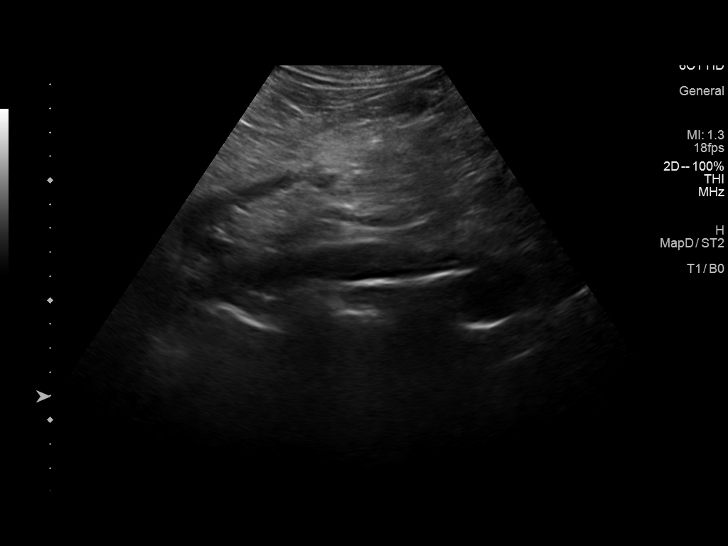
[im 10/25]
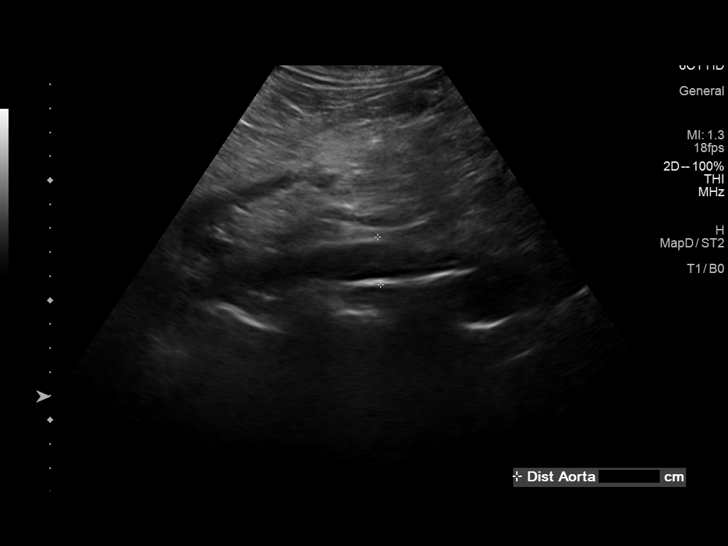
[im 12/25]
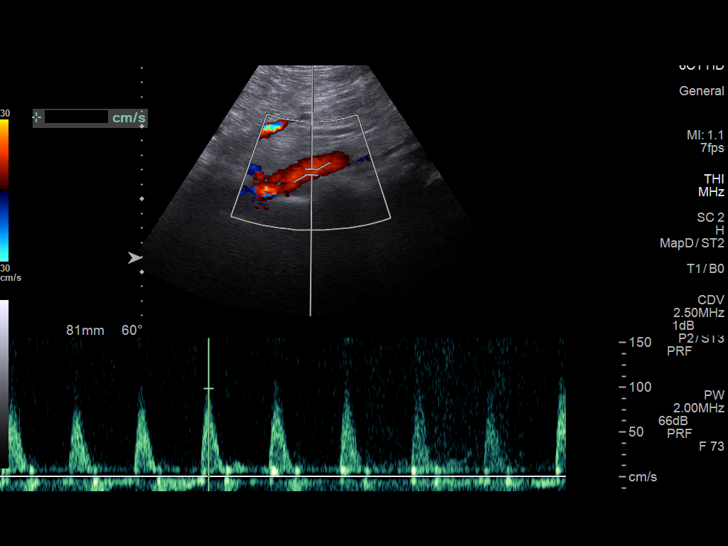
[im 14/25]
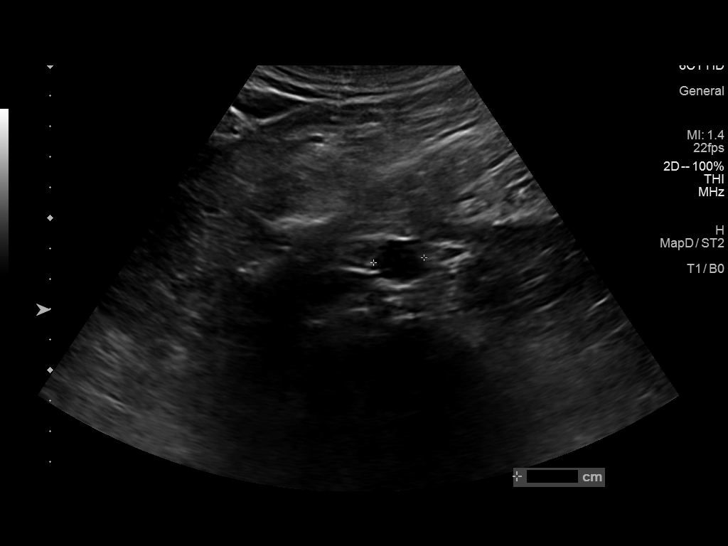
[im 16/25]
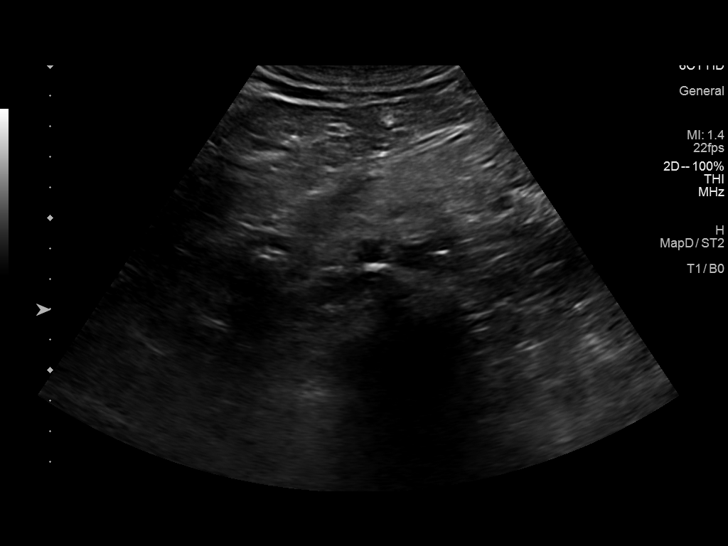
[im 17/25]
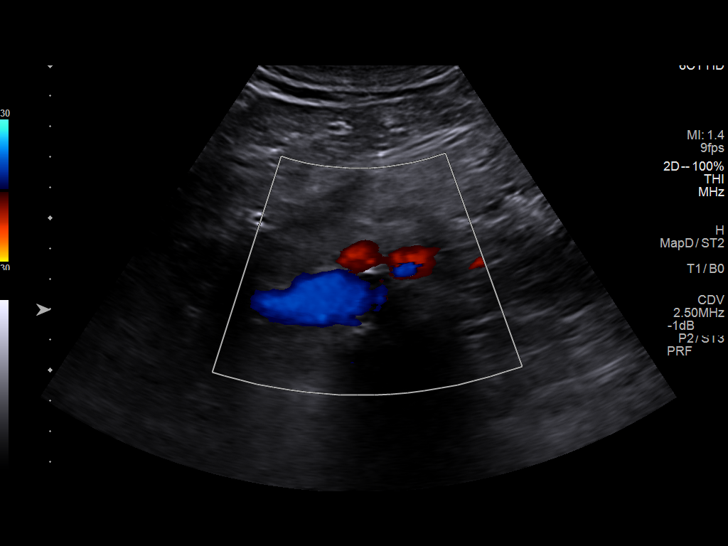
[im 19/25]
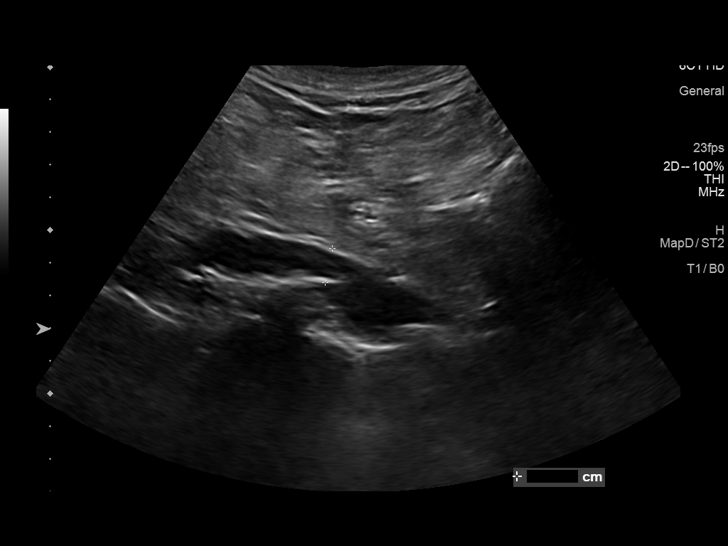
[im 21/25]
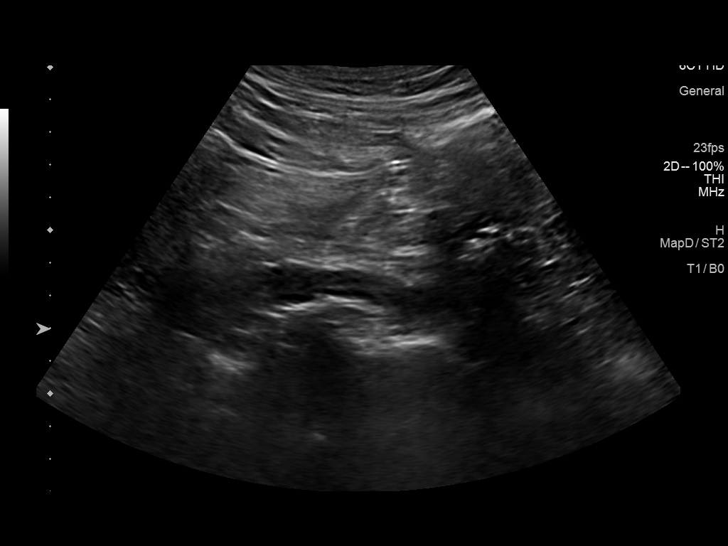
[im 23/25]
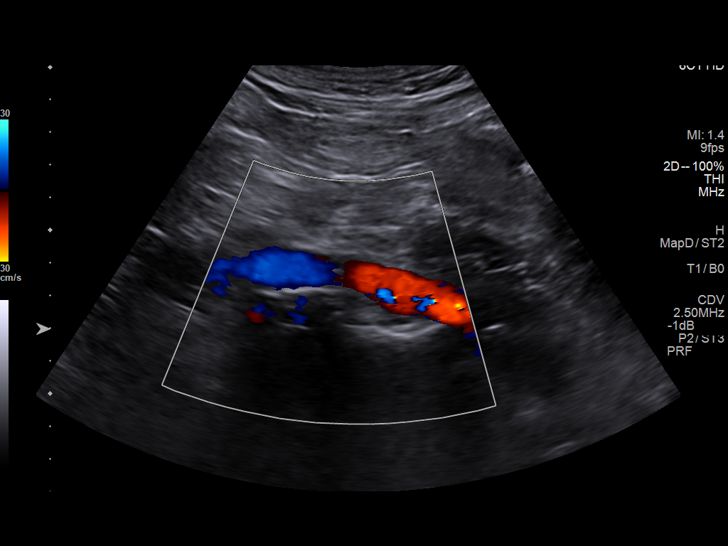
[im 25/25]
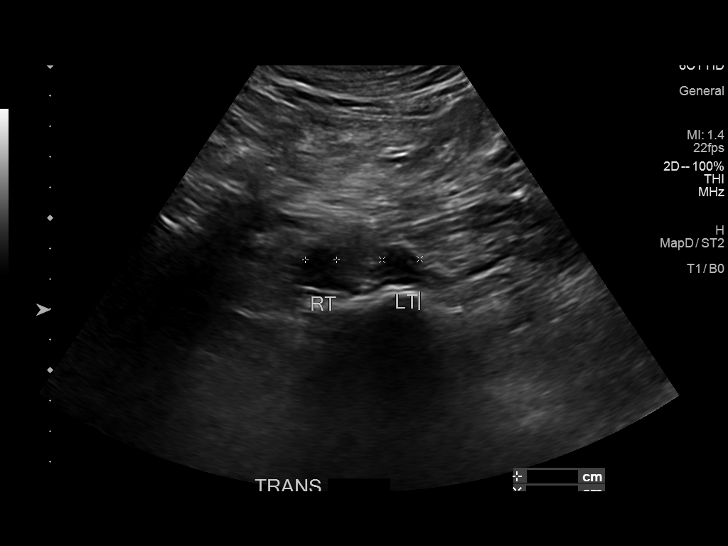

[14 of 25 positions shown; findings below may reference images not displayed]

FINDINGS: Abdominal aortic measurements as follows:

Proximal:  2.3 x 2.1 cm

Mid:  2.0 x 1.8 cm

Distal:  2.0 x 1.7 cm
Patent: Yes, peak systolic velocity is 99 cm/s

Right common iliac artery: 1.1 x 1.0 cm

Left common iliac artery: 1.2 x 1.2 cm
IMPRESSION: No evidence of abdominal aortic aneurysm.

## 2020-09-29 DIAGNOSIS — M25641 Stiffness of right hand, not elsewhere classified: Secondary | ICD-10-CM | POA: Diagnosis not present

## 2020-10-07 ENCOUNTER — Other Ambulatory Visit (HOSPITAL_COMMUNITY): Payer: Self-pay

## 2020-10-08 ENCOUNTER — Other Ambulatory Visit (HOSPITAL_COMMUNITY): Payer: Self-pay

## 2020-10-09 ENCOUNTER — Other Ambulatory Visit (HOSPITAL_COMMUNITY): Payer: Self-pay

## 2020-10-12 ENCOUNTER — Other Ambulatory Visit (HOSPITAL_COMMUNITY): Payer: Self-pay

## 2020-10-12 MED ORDER — OLMESARTAN MEDOXOMIL 20 MG PO TABS
ORAL_TABLET | ORAL | 4 refills | Status: DC
  Filled 2020-10-12: qty 90, 90d supply, fill #0
  Filled 2021-01-26: qty 90, 90d supply, fill #1
  Filled 2021-06-24: qty 90, 90d supply, fill #2

## 2020-10-13 DIAGNOSIS — M25641 Stiffness of right hand, not elsewhere classified: Secondary | ICD-10-CM | POA: Diagnosis not present

## 2020-10-14 ENCOUNTER — Other Ambulatory Visit: Payer: 59

## 2020-11-03 DIAGNOSIS — M25641 Stiffness of right hand, not elsewhere classified: Secondary | ICD-10-CM | POA: Diagnosis not present

## 2020-11-06 ENCOUNTER — Other Ambulatory Visit (HOSPITAL_COMMUNITY): Payer: Self-pay

## 2020-11-06 MED FILL — Tadalafil Tab 20 MG: ORAL | 90 days supply | Qty: 18 | Fill #0 | Status: CN

## 2020-11-06 MED FILL — Tadalafil Tab 20 MG: ORAL | 90 days supply | Qty: 18 | Fill #0 | Status: AC

## 2020-11-10 ENCOUNTER — Other Ambulatory Visit (HOSPITAL_COMMUNITY): Payer: Self-pay

## 2020-11-10 ENCOUNTER — Other Ambulatory Visit (HOSPITAL_BASED_OUTPATIENT_CLINIC_OR_DEPARTMENT_OTHER): Payer: Self-pay

## 2020-11-10 MED FILL — Pantoprazole Sodium EC Tab 40 MG (Base Equiv): ORAL | 90 days supply | Qty: 90 | Fill #0 | Status: AC

## 2020-11-24 DIAGNOSIS — M25641 Stiffness of right hand, not elsewhere classified: Secondary | ICD-10-CM | POA: Diagnosis not present

## 2020-12-22 DIAGNOSIS — D485 Neoplasm of uncertain behavior of skin: Secondary | ICD-10-CM | POA: Diagnosis not present

## 2020-12-22 DIAGNOSIS — L570 Actinic keratosis: Secondary | ICD-10-CM | POA: Diagnosis not present

## 2020-12-22 DIAGNOSIS — L738 Other specified follicular disorders: Secondary | ICD-10-CM | POA: Diagnosis not present

## 2020-12-22 DIAGNOSIS — C44619 Basal cell carcinoma of skin of left upper limb, including shoulder: Secondary | ICD-10-CM | POA: Diagnosis not present

## 2020-12-22 DIAGNOSIS — Z85828 Personal history of other malignant neoplasm of skin: Secondary | ICD-10-CM | POA: Diagnosis not present

## 2020-12-22 DIAGNOSIS — D1801 Hemangioma of skin and subcutaneous tissue: Secondary | ICD-10-CM | POA: Diagnosis not present

## 2020-12-22 DIAGNOSIS — L812 Freckles: Secondary | ICD-10-CM | POA: Diagnosis not present

## 2020-12-22 DIAGNOSIS — L821 Other seborrheic keratosis: Secondary | ICD-10-CM | POA: Diagnosis not present

## 2020-12-23 ENCOUNTER — Other Ambulatory Visit (HOSPITAL_COMMUNITY): Payer: Self-pay

## 2020-12-23 DIAGNOSIS — M47896 Other spondylosis, lumbar region: Secondary | ICD-10-CM | POA: Diagnosis not present

## 2020-12-23 MED ORDER — PREDNISONE 10 MG PO TABS
ORAL_TABLET | ORAL | 0 refills | Status: DC
  Filled 2020-12-23: qty 20, 8d supply, fill #0

## 2020-12-27 ENCOUNTER — Other Ambulatory Visit (HOSPITAL_COMMUNITY): Payer: Self-pay

## 2020-12-28 ENCOUNTER — Other Ambulatory Visit (HOSPITAL_COMMUNITY): Payer: Self-pay

## 2020-12-29 ENCOUNTER — Other Ambulatory Visit (HOSPITAL_COMMUNITY): Payer: Self-pay

## 2020-12-29 MED ORDER — ATORVASTATIN CALCIUM 20 MG PO TABS
ORAL_TABLET | ORAL | 3 refills | Status: DC
  Filled 2020-12-29: qty 90, 90d supply, fill #0
  Filled 2021-03-22: qty 90, 90d supply, fill #1
  Filled 2021-06-28: qty 90, 90d supply, fill #2
  Filled 2021-10-01: qty 90, 90d supply, fill #3

## 2021-01-26 ENCOUNTER — Other Ambulatory Visit (HOSPITAL_COMMUNITY): Payer: Self-pay

## 2021-02-08 ENCOUNTER — Other Ambulatory Visit (HOSPITAL_BASED_OUTPATIENT_CLINIC_OR_DEPARTMENT_OTHER): Payer: Self-pay

## 2021-02-08 ENCOUNTER — Other Ambulatory Visit (HOSPITAL_COMMUNITY): Payer: Self-pay

## 2021-02-08 MED FILL — Pantoprazole Sodium EC Tab 40 MG (Base Equiv): ORAL | 90 days supply | Qty: 90 | Fill #1 | Status: AC

## 2021-02-08 MED FILL — Sulfacetamide Sodium w/ Sulfur Cream 10-5%: CUTANEOUS | 15 days supply | Qty: 28 | Fill #0 | Status: CN

## 2021-02-11 ENCOUNTER — Other Ambulatory Visit (HOSPITAL_COMMUNITY): Payer: Self-pay

## 2021-02-11 MED ORDER — SHINGRIX 50 MCG/0.5ML IM SUSR
INTRAMUSCULAR | 1 refills | Status: AC
  Filled 2021-02-11: qty 0.5, 1d supply, fill #0
  Filled 2021-02-11 – 2021-05-06 (×2): qty 0.5, 1d supply, fill #1

## 2021-02-11 MED FILL — Sulfacetamide Sodium w/ Sulfur Cream 10-5%: CUTANEOUS | 28 days supply | Qty: 28 | Fill #0 | Status: CN

## 2021-02-12 ENCOUNTER — Other Ambulatory Visit (HOSPITAL_COMMUNITY): Payer: Self-pay

## 2021-02-13 DIAGNOSIS — Z23 Encounter for immunization: Secondary | ICD-10-CM | POA: Diagnosis not present

## 2021-02-15 ENCOUNTER — Other Ambulatory Visit (HOSPITAL_COMMUNITY): Payer: Self-pay

## 2021-02-16 ENCOUNTER — Other Ambulatory Visit (HOSPITAL_COMMUNITY): Payer: Self-pay

## 2021-02-16 MED ORDER — SULFACETAMIDE SODIUM (ACNE) 10 % EX LOTN
TOPICAL_LOTION | CUTANEOUS | 6 refills | Status: DC
  Filled 2021-02-16: qty 118, 30d supply, fill #0

## 2021-02-16 MED ORDER — PREVNAR 20 0.5 ML IM SUSY
PREFILLED_SYRINGE | INTRAMUSCULAR | 0 refills | Status: AC
  Filled 2021-02-16: qty 0.5, 1d supply, fill #0

## 2021-02-17 ENCOUNTER — Other Ambulatory Visit (HOSPITAL_COMMUNITY): Payer: Self-pay

## 2021-02-19 ENCOUNTER — Other Ambulatory Visit (HOSPITAL_COMMUNITY): Payer: Self-pay

## 2021-03-08 ENCOUNTER — Ambulatory Visit: Payer: 59 | Attending: Internal Medicine

## 2021-03-08 ENCOUNTER — Other Ambulatory Visit (HOSPITAL_BASED_OUTPATIENT_CLINIC_OR_DEPARTMENT_OTHER): Payer: Self-pay

## 2021-03-08 ENCOUNTER — Other Ambulatory Visit: Payer: Self-pay

## 2021-03-08 DIAGNOSIS — Z23 Encounter for immunization: Secondary | ICD-10-CM

## 2021-03-08 MED ORDER — PFIZER COVID-19 VAC BIVALENT 30 MCG/0.3ML IM SUSP
INTRAMUSCULAR | 0 refills | Status: AC
  Filled 2021-03-08: qty 0.3, 1d supply, fill #0

## 2021-03-08 NOTE — Progress Notes (Signed)
   Covid-19 Vaccination Clinic  Name:  Mitchell Jones    MRN: [id number] DOB: 01-02-1956  03/08/2021  [name] was observed post Covid-19 immunization for 15 minutes without incident. He was provided with Vaccine Information Sheet and instruction to access the V-Safe system.   [name] was instructed to call 911 with any severe reactions post vaccine: Difficulty breathing  Swelling of face and throat  A fast heartbeat  A bad rash all over body  Dizziness and weakness   Immunizations Administered     Name Date Dose VIS Date Route   Pfizer Covid-19 Vaccine Bivalent Booster 03/08/2021  2:50 PM 0.3 mL 01/06/2021 Intramuscular   Manufacturer: [place]   Lot: [id number]   [place]: [phone]

## 2021-03-22 ENCOUNTER — Other Ambulatory Visit (HOSPITAL_COMMUNITY): Payer: Self-pay

## 2021-04-13 ENCOUNTER — Other Ambulatory Visit (HOSPITAL_COMMUNITY): Payer: Self-pay

## 2021-05-06 ENCOUNTER — Other Ambulatory Visit (HOSPITAL_COMMUNITY): Payer: Self-pay

## 2021-05-11 ENCOUNTER — Other Ambulatory Visit (HOSPITAL_COMMUNITY): Payer: Self-pay

## 2021-05-11 MED FILL — Pantoprazole Sodium EC Tab 40 MG (Base Equiv): ORAL | 90 days supply | Qty: 90 | Fill #2 | Status: AC

## 2021-06-18 DIAGNOSIS — H524 Presbyopia: Secondary | ICD-10-CM | POA: Diagnosis not present

## 2021-06-24 ENCOUNTER — Other Ambulatory Visit (HOSPITAL_COMMUNITY): Payer: Self-pay

## 2021-06-24 DIAGNOSIS — L918 Other hypertrophic disorders of the skin: Secondary | ICD-10-CM | POA: Diagnosis not present

## 2021-06-24 DIAGNOSIS — I8391 Asymptomatic varicose veins of right lower extremity: Secondary | ICD-10-CM | POA: Diagnosis not present

## 2021-06-24 DIAGNOSIS — Z85828 Personal history of other malignant neoplasm of skin: Secondary | ICD-10-CM | POA: Diagnosis not present

## 2021-06-24 DIAGNOSIS — L812 Freckles: Secondary | ICD-10-CM | POA: Diagnosis not present

## 2021-06-24 DIAGNOSIS — L570 Actinic keratosis: Secondary | ICD-10-CM | POA: Diagnosis not present

## 2021-06-24 DIAGNOSIS — D2272 Melanocytic nevi of left lower limb, including hip: Secondary | ICD-10-CM | POA: Diagnosis not present

## 2021-06-24 DIAGNOSIS — L821 Other seborrheic keratosis: Secondary | ICD-10-CM | POA: Diagnosis not present

## 2021-06-28 ENCOUNTER — Other Ambulatory Visit (HOSPITAL_COMMUNITY): Payer: Self-pay

## 2021-06-28 DIAGNOSIS — Z125 Encounter for screening for malignant neoplasm of prostate: Secondary | ICD-10-CM | POA: Diagnosis not present

## 2021-06-28 DIAGNOSIS — E785 Hyperlipidemia, unspecified: Secondary | ICD-10-CM | POA: Diagnosis not present

## 2021-06-28 DIAGNOSIS — R7301 Impaired fasting glucose: Secondary | ICD-10-CM | POA: Diagnosis not present

## 2021-06-28 DIAGNOSIS — I10 Essential (primary) hypertension: Secondary | ICD-10-CM | POA: Diagnosis not present

## 2021-07-05 DIAGNOSIS — R7301 Impaired fasting glucose: Secondary | ICD-10-CM | POA: Diagnosis not present

## 2021-07-05 DIAGNOSIS — Z1339 Encounter for screening examination for other mental health and behavioral disorders: Secondary | ICD-10-CM | POA: Diagnosis not present

## 2021-07-05 DIAGNOSIS — E785 Hyperlipidemia, unspecified: Secondary | ICD-10-CM | POA: Diagnosis not present

## 2021-07-05 DIAGNOSIS — I10 Essential (primary) hypertension: Secondary | ICD-10-CM | POA: Diagnosis not present

## 2021-07-05 DIAGNOSIS — E669 Obesity, unspecified: Secondary | ICD-10-CM | POA: Diagnosis not present

## 2021-07-05 DIAGNOSIS — Z1331 Encounter for screening for depression: Secondary | ICD-10-CM | POA: Diagnosis not present

## 2021-07-05 DIAGNOSIS — K219 Gastro-esophageal reflux disease without esophagitis: Secondary | ICD-10-CM | POA: Diagnosis not present

## 2021-07-05 DIAGNOSIS — E8881 Metabolic syndrome: Secondary | ICD-10-CM | POA: Diagnosis not present

## 2021-07-05 DIAGNOSIS — Z86010 Personal history of colonic polyps: Secondary | ICD-10-CM | POA: Diagnosis not present

## 2021-07-05 DIAGNOSIS — F5221 Male erectile disorder: Secondary | ICD-10-CM | POA: Diagnosis not present

## 2021-07-05 DIAGNOSIS — Z0000 Encounter for general adult medical examination without abnormal findings: Secondary | ICD-10-CM | POA: Diagnosis not present

## 2021-07-12 ENCOUNTER — Ambulatory Visit: Payer: 59

## 2021-07-12 ENCOUNTER — Telehealth: Payer: Self-pay

## 2021-07-12 ENCOUNTER — Other Ambulatory Visit: Payer: Self-pay

## 2021-07-12 ENCOUNTER — Ambulatory Visit: Payer: 59 | Admitting: Podiatry

## 2021-07-12 ENCOUNTER — Ambulatory Visit (INDEPENDENT_AMBULATORY_CARE_PROVIDER_SITE_OTHER): Payer: 59

## 2021-07-12 DIAGNOSIS — M21612 Bunion of left foot: Secondary | ICD-10-CM | POA: Diagnosis not present

## 2021-07-12 DIAGNOSIS — M7751 Other enthesopathy of right foot: Secondary | ICD-10-CM | POA: Diagnosis not present

## 2021-07-12 DIAGNOSIS — M216X9 Other acquired deformities of unspecified foot: Secondary | ICD-10-CM | POA: Diagnosis not present

## 2021-07-12 DIAGNOSIS — M21619 Bunion of unspecified foot: Secondary | ICD-10-CM

## 2021-07-12 DIAGNOSIS — M778 Other enthesopathies, not elsewhere classified: Secondary | ICD-10-CM | POA: Diagnosis not present

## 2021-07-12 DIAGNOSIS — M19071 Primary osteoarthritis, right ankle and foot: Secondary | ICD-10-CM

## 2021-07-12 MED ORDER — TRIAMCINOLONE ACETONIDE 10 MG/ML IJ SUSP
10.0000 mg | Freq: Once | INTRAMUSCULAR | Status: AC
  Administered 2021-07-12: 10 mg

## 2021-07-12 NOTE — Progress Notes (Signed)
SITUATION ?Reason for Consult: Evaluation for Bilateral Custom Foot Orthoses ?Patient / Caregiver Report: Patient is ready for foot orthotics ? ?OBJECTIVE DATA: ?Patient History / Diagnosis:  ?  ICD-10-CM   ?1. Capsulitis of foot, right  M77.8   ?  ?2. Bunion  M21.619   ?  ? ? ?Current or Previous Devices: Current user ? ?Foot Examination: ?Skin presentation:   Intact ?Ulcers & Callousing:   None and no history ?Toe / Foot Deformities:  Bunion ?Weight Bearing Presentation:  Rectus ?Sensation:    Intact ? ?Shoe Size 76M ? ?ORTHOTIC RECOMMENDATION ?Recommended Device: 1x pair of custom functional foot orthotics ? ?GOALS OF ORTHOSES ?- Reduce Pain ?- Prevent Foot Deformity ?- Prevent Progression of Further Foot Deformity ?- Relieve Pressure ?- Improve the Overall Biomechanical Function of the Foot and Lower Extremity. ? ?ACTIONS PERFORMED ?Patient was casted for Foot Orthoses via crush box. Procedure was explained and patient tolerated procedure well. All questions were answered and concerns addressed. ? ?PLAN ?Potential out of pocket cost was communicated to patient. Casts are to be sent to [facility] for fabrication. Patient is to be called for fitting when devices are ready.  ? ? ?

## 2021-07-12 NOTE — Telephone Encounter (Signed)
Casts sent to central fabrication °

## 2021-07-15 NOTE — Progress Notes (Signed)
Subjective:  ? ?Patient ID: Roosvelt Harps, male   DOB: 66 y.o.   MRN: [id number]  ? ?HPI ?66 year old male presents the office today for concerns of right foot bunion, bilateral foot discomfort requesting new orthotics.  Patient was seen by Dr. Paulla Dolly in 2016 for similar issues and bunion on the right foot is steroid injection in the form.  Patient is required another steroid injection today.  No recent injuries to his feet.  He has no other concerns today. ? ? ?Review of Systems  ?All other systems reviewed and are negative. ? ?No past medical history on file. ? ?No past surgical history on file. ? ? ?Current Outpatient Medications:  ?  albuterol (PROVENTIL HFA;VENTOLIN HFA) 108 (90 Base) MCG/ACT inhaler, Inhale 2 puffs into the lungs every 6 (six) hours as needed for wheezing or shortness of breath., Disp: 1 Inhaler, Rfl: 0 ?  aspirin 81 MG tablet, Take 81 mg by mouth daily., Disp: , Rfl:  ?  atorvastatin (LIPITOR) 10 MG tablet, Take 10 mg by mouth daily., Disp: , Rfl:  ?  atorvastatin (LIPITOR) 20 MG tablet, TAKE 1 TABLET BY MOUTH AT BEDTIME, Disp: 90 tablet, Rfl: 3 ?  atorvastatin (LIPITOR) 20 MG tablet, Take 1 tablet by mouth once daily, Disp: 90 tablet, Rfl: 3 ?  azelastine (ASTELIN) 0.1 % nasal spray, Place 1 spray into both nostrils 2 (two) times daily. Use in each nostril as directed, Disp: 30 mL, Rfl: 0 ?  b complex vitamins capsule, Take by mouth., Disp: , Rfl:  ?  brompheniramine-pseudoephedrine-DM 30-2-10 MG/5ML syrup, Take 10 mLs by mouth 3 (three) times daily as needed., Disp: 120 mL, Rfl: 0 ?  cholecalciferol (VITAMIN D) 400 UNITS TABS tablet, Take 400 Units by mouth., Disp: , Rfl:  ?  COVID-19 mRNA bivalent vaccine, Pfizer, (PFIZER COVID-19 VAC BIVALENT) injection, Inject into the muscle., Disp: 0.3 mL, Rfl: 0 ?  COVID-19 mRNA Vac-TriS, Pfizer, (PFIZER-BIONT COVID-19 VAC-TRIS) SUSP injection, Inject into the muscle., Disp: 0.3 mL, Rfl: 0 ?  dextromethorphan-guaiFENesin (MUCINEX DM) 30-600 MG 12hr  tablet, Take 1 tablet by mouth 2 (two) times daily., Disp: , Rfl:  ?  naproxen sodium (ALEVE) 220 MG tablet, Take by mouth., Disp: , Rfl:  ?  olmesartan (BENICAR) 20 MG tablet, TAKE 1 TABLET BY MOUTH EVERY NIGHT AT BEDTIME, Disp: 90 tablet, Rfl: 2 ?  olmesartan (BENICAR) 20 MG tablet, Take 1 tablet by mouth once a day at bedtime, Disp: 90 tablet, Rfl: 4 ?  pantoprazole (PROTONIX) 20 MG tablet, Take 20 mg by mouth daily., Disp: , Rfl:  ?  pantoprazole (PROTONIX) 40 MG tablet, TAKE 1 TABLET BY MOUTH ONCE A DAY, Disp: 90 tablet, Rfl: 4 ?  pantoprazole (PROTONIX) 40 MG tablet, TAKE 1 TABLET BY MOUTH ONCE DAILY, Disp: 90 tablet, Rfl: 3 ?  pantoprazole (PROTONIX) 40 MG tablet, TAKE 1 TABLET BY MOUTH ONCE DAILY, Disp: 90 tablet, Rfl: 2 ?  pneumococcal 20-valent conjugate vaccine (PREVNAR 20) 0.5 ML injection, To be administered by the pharmacist, Disp: 0.5 mL, Rfl: 0 ?  predniSONE (DELTASONE) 10 MG tablet, Take 4 tablets by mouth once daily for 2 days, then 3 tablets daily for 2 days, then 2 tablets daily for 2 days, then 1 tablet  for 2 days., Disp: 20 tablet, Rfl: 0 ?  pseudoephedrine (SUDAFED) 60 MG tablet, Take 60 mg by mouth every 4 (four) hours as needed for congestion., Disp: , Rfl:  ?  Sulfacetamide Sodium, Acne, 10 % LOTN, Apply  as directed to skin once a day, Disp: 118 mL, Rfl: 6 ?  tadalafil (CIALIS) 10 MG tablet, Take 10 mg by mouth daily as needed for erectile dysfunction., Disp: , Rfl:  ?  tadalafil (CIALIS) 20 MG tablet, TAKE 1 TABLET BY MOUTH DAILY AS NEEDED FOR ERECTILE DYSFUNCTION., Disp: 18 tablet, Rfl: 3 ?  Zoster Vaccine Adjuvanted [facility]) injection, Inject into the muscle., Disp: 0.5 mL, Rfl: 1 ? ?No Known Allergies ? ? ? ? ?   ?Objective:  ?Physical Exam  ?General: AAO x3, NAD ? ?Dermatological: 2 annular hyperkeratotic lesions noted at the medial aspect of the first metatarsal head on the right foot without any underlying ulceration drainage or any signs of infection. ? ?Vascular: Dorsalis Pedis  artery and Posterior Tibial artery pedal pulses are 2/4 bilateral with immedate capillary fill time. There is no pain with calf compression, swelling, warmth, erythema.  ? ?Neruologic: Grossly intact via light touch bilateral.  ? ?Musculoskeletal: Moderate to severe bunions present on the right foot.  There is tenderness on the first MPJ.  There is no specific area of tenderness otherwise.  Dorsal spurring present of the midfoot.  muscular strength 5/5 in all groups tested bilateral. ? ?Gait: Unassisted, Nonantalgic.  ? ? ?   ?Assessment:  ? ?66 year old male bunion, capsulitis right foot, arthritis ? ?   ?Plan:  ?-Treatment options discussed including all alternatives, risks, and complications ?-Etiology of symptoms were discussed ?-X-rays today reviewed.  No evidence of acute fracture.  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 "Z" shape foot noted.  ?-Patient is requesting new orthotics need to be seen by Aaron Edelman, our orthotist was today. ?-Regarding the bunion we discussed both conservative as well as surgical treatment options.  Conservatively we discussed shoe modifications, offloading pads.  Discussed surgical intervention as well if needed.  Patient is requesting steroid injection.  Skin was prepped with Betadine, alcohol and mixture 1 cc Kenalog 10, 0.5 cc of Marcaine plain, 0.5 cc of lidocaine plain without any complications into the first MPJ and along the medial aspect.  Postinjection care discussed.  Tolerated well.  Afterwards a sharp debrided the hyperkeratotic lesion without any complications or bleeding. ? ?Trula Slade DPM ? ? ?   ? ?

## 2021-07-16 ENCOUNTER — Other Ambulatory Visit (HOSPITAL_COMMUNITY): Payer: Self-pay

## 2021-07-16 MED ORDER — OLMESARTAN MEDOXOMIL 40 MG PO TABS
ORAL_TABLET | ORAL | 4 refills | Status: DC
  Filled 2021-07-16: qty 90, 90d supply, fill #0
  Filled 2021-11-30: qty 90, 90d supply, fill #1
  Filled 2022-03-17: qty 30, 30d supply, fill #2
  Filled 2022-03-17: qty 60, 60d supply, fill #2
  Filled 2022-06-16: qty 90, 90d supply, fill #3

## 2021-07-17 ENCOUNTER — Other Ambulatory Visit (HOSPITAL_COMMUNITY): Payer: Self-pay

## 2021-07-19 ENCOUNTER — Other Ambulatory Visit (HOSPITAL_COMMUNITY): Payer: Self-pay

## 2021-08-02 ENCOUNTER — Other Ambulatory Visit (HOSPITAL_COMMUNITY): Payer: Self-pay

## 2021-08-02 MED FILL — Pantoprazole Sodium EC Tab 40 MG (Base Equiv): ORAL | 90 days supply | Qty: 90 | Fill #3 | Status: AC

## 2021-08-12 ENCOUNTER — Other Ambulatory Visit (HOSPITAL_COMMUNITY): Payer: Self-pay

## 2021-08-12 ENCOUNTER — Telehealth: Payer: Self-pay | Admitting: Podiatry

## 2021-08-12 MED ORDER — HYDROCHLOROTHIAZIDE 12.5 MG PO TABS
ORAL_TABLET | ORAL | 6 refills | Status: DC
  Filled 2021-08-12: qty 30, 30d supply, fill #0
  Filled 2021-09-13: qty 30, 30d supply, fill #1
  Filled 2021-10-18: qty 30, 30d supply, fill #2
  Filled 2021-11-15: qty 30, 30d supply, fill #3
  Filled 2021-12-14: qty 30, 30d supply, fill #4
  Filled 2022-01-15: qty 30, 30d supply, fill #5
  Filled 2022-02-13: qty 30, 30d supply, fill #6

## 2021-08-12 NOTE — Telephone Encounter (Signed)
Left vmail for opu ?

## 2021-08-19 ENCOUNTER — Ambulatory Visit (INDEPENDENT_AMBULATORY_CARE_PROVIDER_SITE_OTHER): Payer: 59

## 2021-08-19 DIAGNOSIS — M19071 Primary osteoarthritis, right ankle and foot: Secondary | ICD-10-CM

## 2021-08-19 DIAGNOSIS — M216X9 Other acquired deformities of unspecified foot: Secondary | ICD-10-CM

## 2021-08-19 DIAGNOSIS — M21619 Bunion of unspecified foot: Secondary | ICD-10-CM

## 2021-08-19 DIAGNOSIS — M778 Other enthesopathies, not elsewhere classified: Secondary | ICD-10-CM

## 2021-08-19 NOTE — Progress Notes (Signed)
SITUATION: ?Reason for Visit: Fitting and Delivery of Custom Fabricated Foot Orthoses ?Patient Report: Patient reports comfort and is satisfied with device. ? ?OBJECTIVE DATA: ?Patient History / Diagnosis:   ?  ICD-10-CM   ?1. Capsulitis of foot, right  M77.8   ?  ?2. Bunion  M21.619   ?  ?3. 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  ?4. 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  ? ? ?Provided Device:  Custom Functional Foot Orthotics ?    [employer]: [id number] ? ?GOAL OF ORTHOSIS ?- Improve gait ?- Decrease energy expenditure ?- Improve Balance ?- Provide Triplanar stability of foot complex ?- Facilitate motion ? ?ACTIONS PERFORMED ?Patient was fit with foot orthotics trimmed to shoe last. Patient tolerated fittign procedure.  ? ?Patient was provided with verbal and written instruction and demonstration regarding donning, doffing, wear, care, proper fit, function, purpose, cleaning, and use of the orthosis and in all related precautions and risks and benefits regarding the orthosis. ? ?Patient was also provided with verbal instruction regarding how to report any failures or malfunctions of the orthosis and necessary follow up care. Patient was also instructed to contact our office regarding any change in status that may affect the function of the orthosis. ? ?Patient demonstrated independence with proper donning, doffing, and fit and verbalized understanding of all instructions. ? ?PLAN: ?Patient is to follow up in one week or as necessary (PRN). All questions were answered and concerns addressed. Plan of care was discussed with and agreed upon by the patient. ? ?

## 2021-09-13 ENCOUNTER — Other Ambulatory Visit (HOSPITAL_COMMUNITY): Payer: Self-pay

## 2021-09-16 ENCOUNTER — Other Ambulatory Visit (HOSPITAL_COMMUNITY): Payer: Self-pay

## 2021-09-16 DIAGNOSIS — R0681 Apnea, not elsewhere classified: Secondary | ICD-10-CM | POA: Diagnosis not present

## 2021-09-16 DIAGNOSIS — G4719 Other hypersomnia: Secondary | ICD-10-CM | POA: Diagnosis not present

## 2021-09-23 ENCOUNTER — Other Ambulatory Visit (HOSPITAL_COMMUNITY): Payer: Self-pay

## 2021-09-23 MED ORDER — TADALAFIL 20 MG PO TABS
ORAL_TABLET | ORAL | 3 refills | Status: AC
  Filled 2021-09-23: qty 30, 30d supply, fill #0
  Filled 2022-01-15: qty 30, 30d supply, fill #1
  Filled 2022-06-22: qty 30, 30d supply, fill #2

## 2021-10-01 ENCOUNTER — Other Ambulatory Visit (HOSPITAL_COMMUNITY): Payer: Self-pay

## 2021-10-18 ENCOUNTER — Other Ambulatory Visit (HOSPITAL_COMMUNITY): Payer: Self-pay

## 2021-10-26 DIAGNOSIS — G4733 Obstructive sleep apnea (adult) (pediatric): Secondary | ICD-10-CM | POA: Diagnosis not present

## 2021-11-15 ENCOUNTER — Other Ambulatory Visit (HOSPITAL_COMMUNITY): Payer: Self-pay

## 2021-11-15 DIAGNOSIS — G4733 Obstructive sleep apnea (adult) (pediatric): Secondary | ICD-10-CM | POA: Diagnosis not present

## 2021-11-18 ENCOUNTER — Other Ambulatory Visit (HOSPITAL_COMMUNITY): Payer: Self-pay

## 2021-11-18 MED ORDER — PANTOPRAZOLE SODIUM 40 MG PO TBEC
DELAYED_RELEASE_TABLET | ORAL | 6 refills | Status: DC
  Filled 2021-11-18: qty 30, 30d supply, fill #0
  Filled 2021-12-14: qty 30, 30d supply, fill #1
  Filled 2022-01-15: qty 30, 30d supply, fill #2
  Filled 2022-02-13: qty 30, 30d supply, fill #3
  Filled 2022-03-17: qty 30, 30d supply, fill #4
  Filled 2022-04-13: qty 30, 30d supply, fill #5
  Filled 2022-05-18: qty 30, 30d supply, fill #6

## 2021-11-19 ENCOUNTER — Other Ambulatory Visit (HOSPITAL_COMMUNITY): Payer: Self-pay

## 2021-11-30 ENCOUNTER — Other Ambulatory Visit (HOSPITAL_COMMUNITY): Payer: Self-pay

## 2021-12-02 ENCOUNTER — Other Ambulatory Visit (HOSPITAL_COMMUNITY): Payer: Self-pay

## 2021-12-14 ENCOUNTER — Other Ambulatory Visit (HOSPITAL_COMMUNITY): Payer: Self-pay

## 2021-12-16 DIAGNOSIS — G4733 Obstructive sleep apnea (adult) (pediatric): Secondary | ICD-10-CM | POA: Diagnosis not present

## 2022-01-03 ENCOUNTER — Other Ambulatory Visit (HOSPITAL_COMMUNITY): Payer: Self-pay

## 2022-01-03 DIAGNOSIS — B354 Tinea corporis: Secondary | ICD-10-CM | POA: Diagnosis not present

## 2022-01-03 DIAGNOSIS — L821 Other seborrheic keratosis: Secondary | ICD-10-CM | POA: Diagnosis not present

## 2022-01-03 DIAGNOSIS — D485 Neoplasm of uncertain behavior of skin: Secondary | ICD-10-CM | POA: Diagnosis not present

## 2022-01-03 DIAGNOSIS — D1801 Hemangioma of skin and subcutaneous tissue: Secondary | ICD-10-CM | POA: Diagnosis not present

## 2022-01-03 DIAGNOSIS — L812 Freckles: Secondary | ICD-10-CM | POA: Diagnosis not present

## 2022-01-03 DIAGNOSIS — C4442 Squamous cell carcinoma of skin of scalp and neck: Secondary | ICD-10-CM | POA: Diagnosis not present

## 2022-01-03 DIAGNOSIS — Z85828 Personal history of other malignant neoplasm of skin: Secondary | ICD-10-CM | POA: Diagnosis not present

## 2022-01-03 DIAGNOSIS — L570 Actinic keratosis: Secondary | ICD-10-CM | POA: Diagnosis not present

## 2022-01-03 DIAGNOSIS — L918 Other hypertrophic disorders of the skin: Secondary | ICD-10-CM | POA: Diagnosis not present

## 2022-01-03 MED ORDER — KETOCONAZOLE 2 % EX CREA
TOPICAL_CREAM | CUTANEOUS | 0 refills | Status: AC
  Filled 2022-01-03: qty 60, 20d supply, fill #0

## 2022-01-06 ENCOUNTER — Other Ambulatory Visit (HOSPITAL_COMMUNITY): Payer: Self-pay

## 2022-01-06 MED ORDER — ATORVASTATIN CALCIUM 20 MG PO TABS
20.0000 mg | ORAL_TABLET | Freq: Every day | ORAL | 3 refills | Status: DC
  Filled 2022-01-06: qty 90, 90d supply, fill #0
  Filled 2022-04-13: qty 90, 90d supply, fill #1
  Filled 2022-07-17: qty 90, 90d supply, fill #2
  Filled 2022-10-24: qty 90, 90d supply, fill #3

## 2022-01-15 ENCOUNTER — Other Ambulatory Visit (HOSPITAL_COMMUNITY): Payer: Self-pay

## 2022-01-16 DIAGNOSIS — G4733 Obstructive sleep apnea (adult) (pediatric): Secondary | ICD-10-CM | POA: Diagnosis not present

## 2022-01-17 DIAGNOSIS — G4733 Obstructive sleep apnea (adult) (pediatric): Secondary | ICD-10-CM | POA: Diagnosis not present

## 2022-01-28 ENCOUNTER — Other Ambulatory Visit (HOSPITAL_COMMUNITY): Payer: Self-pay

## 2022-01-28 MED ORDER — FLUOROURACIL 5 % EX CREA
1.0000 | TOPICAL_CREAM | Freq: Two times a day (BID) | CUTANEOUS | 0 refills | Status: AC
  Filled 2022-01-28: qty 40, 20d supply, fill #0

## 2022-02-01 DIAGNOSIS — G4733 Obstructive sleep apnea (adult) (pediatric): Secondary | ICD-10-CM | POA: Diagnosis not present

## 2022-02-14 ENCOUNTER — Other Ambulatory Visit (HOSPITAL_COMMUNITY): Payer: Self-pay

## 2022-02-15 DIAGNOSIS — G4733 Obstructive sleep apnea (adult) (pediatric): Secondary | ICD-10-CM | POA: Diagnosis not present

## 2022-02-19 DIAGNOSIS — Z23 Encounter for immunization: Secondary | ICD-10-CM | POA: Diagnosis not present

## 2022-03-02 DIAGNOSIS — G4733 Obstructive sleep apnea (adult) (pediatric): Secondary | ICD-10-CM | POA: Diagnosis not present

## 2022-03-17 ENCOUNTER — Other Ambulatory Visit (HOSPITAL_COMMUNITY): Payer: Self-pay

## 2022-03-17 MED ORDER — HYDROCHLOROTHIAZIDE 12.5 MG PO TABS
12.5000 mg | ORAL_TABLET | Freq: Every morning | ORAL | 3 refills | Status: DC
  Filled 2022-03-17: qty 90, 90d supply, fill #0
  Filled 2022-06-16: qty 90, 90d supply, fill #1
  Filled 2022-09-13: qty 90, 90d supply, fill #2
  Filled 2022-11-21: qty 90, 90d supply, fill #3

## 2022-03-18 ENCOUNTER — Other Ambulatory Visit (HOSPITAL_COMMUNITY): Payer: Self-pay

## 2022-04-04 ENCOUNTER — Other Ambulatory Visit (HOSPITAL_BASED_OUTPATIENT_CLINIC_OR_DEPARTMENT_OTHER): Payer: Self-pay

## 2022-04-04 MED ORDER — AREXVY 120 MCG/0.5ML IM SUSR
INTRAMUSCULAR | 0 refills | Status: AC
  Filled 2022-04-04: qty 0.5, 1d supply, fill #0

## 2022-04-13 ENCOUNTER — Other Ambulatory Visit (HOSPITAL_COMMUNITY): Payer: Self-pay

## 2022-04-15 ENCOUNTER — Other Ambulatory Visit (HOSPITAL_COMMUNITY): Payer: Self-pay

## 2022-06-04 DIAGNOSIS — G4733 Obstructive sleep apnea (adult) (pediatric): Secondary | ICD-10-CM | POA: Diagnosis not present

## 2022-06-11 ENCOUNTER — Other Ambulatory Visit (HOSPITAL_COMMUNITY): Payer: Self-pay

## 2022-06-13 ENCOUNTER — Other Ambulatory Visit (HOSPITAL_COMMUNITY): Payer: Self-pay

## 2022-06-13 MED ORDER — SULFACETAMIDE SODIUM (ACNE) 10 % EX LOTN
TOPICAL_LOTION | CUTANEOUS | 2 refills | Status: DC
  Filled 2022-06-13: qty 118, 30d supply, fill #0
  Filled 2023-04-14: qty 118, 30d supply, fill #1

## 2022-06-14 ENCOUNTER — Other Ambulatory Visit (HOSPITAL_COMMUNITY): Payer: Self-pay

## 2022-06-16 ENCOUNTER — Other Ambulatory Visit (HOSPITAL_COMMUNITY): Payer: Self-pay

## 2022-06-16 ENCOUNTER — Other Ambulatory Visit: Payer: Self-pay

## 2022-06-16 MED ORDER — PANTOPRAZOLE SODIUM 40 MG PO TBEC
DELAYED_RELEASE_TABLET | ORAL | 3 refills | Status: DC
  Filled 2022-06-16: qty 90, 90d supply, fill #0
  Filled 2022-09-13: qty 90, 90d supply, fill #1
  Filled 2022-11-21: qty 90, 90d supply, fill #2
  Filled 2023-03-14: qty 90, 90d supply, fill #3

## 2022-06-17 ENCOUNTER — Other Ambulatory Visit (HOSPITAL_COMMUNITY): Payer: Self-pay

## 2022-07-13 ENCOUNTER — Other Ambulatory Visit (HOSPITAL_COMMUNITY): Payer: Self-pay

## 2022-07-13 DIAGNOSIS — I10 Essential (primary) hypertension: Secondary | ICD-10-CM | POA: Diagnosis not present

## 2022-07-13 MED ORDER — CYCLOBENZAPRINE HCL 10 MG PO TABS
10.0000 mg | ORAL_TABLET | Freq: Three times a day (TID) | ORAL | 1 refills | Status: DC | PRN
  Filled 2022-07-13: qty 90, 30d supply, fill #0

## 2022-07-20 ENCOUNTER — Other Ambulatory Visit (HOSPITAL_COMMUNITY): Payer: Self-pay

## 2022-08-06 ENCOUNTER — Emergency Department (HOSPITAL_BASED_OUTPATIENT_CLINIC_OR_DEPARTMENT_OTHER): Payer: Medicare Other

## 2022-08-06 ENCOUNTER — Emergency Department (HOSPITAL_BASED_OUTPATIENT_CLINIC_OR_DEPARTMENT_OTHER)
Admission: EM | Admit: 2022-08-06 | Discharge: 2022-08-06 | Disposition: A | Discharge status: Home / Self Care | Payer: Medicare Other | Attending: Emergency Medicine | Admitting: Emergency Medicine

## 2022-08-06 ENCOUNTER — Encounter (HOSPITAL_BASED_OUTPATIENT_CLINIC_OR_DEPARTMENT_OTHER): Payer: Self-pay | Admitting: Emergency Medicine

## 2022-08-06 DIAGNOSIS — M545 Low back pain, unspecified: Secondary | ICD-10-CM | POA: Diagnosis present

## 2022-08-06 MED ORDER — OXYCODONE HCL 5 MG PO TABS
10.0000 mg | ORAL_TABLET | Freq: Once | ORAL | Status: AC
  Administered 2022-08-06: 10 mg via ORAL
  Filled 2022-08-06: qty 2

## 2022-08-06 MED ORDER — KETOROLAC TROMETHAMINE 15 MG/ML IJ SOLN
15.0000 mg | Freq: Once | INTRAMUSCULAR | Status: AC
  Administered 2022-08-06: 15 mg via INTRAMUSCULAR
  Filled 2022-08-06: qty 1

## 2022-08-06 MED ORDER — CYCLOBENZAPRINE HCL 10 MG PO TABS: 10.0000 mg | ORAL_TABLET | Freq: Two times a day (BID) | ORAL | 0 refills | Status: AC | PRN

## 2022-08-06 MED ORDER — OXYCODONE HCL 5 MG PO TABS: 5.0000 mg | ORAL_TABLET | ORAL | 0 refills | Status: AC | PRN

## 2022-08-06 MED ORDER — ACETAMINOPHEN 500 MG PO TABS
1000.0000 mg | ORAL_TABLET | Freq: Once | ORAL | Status: AC
  Administered 2022-08-06: 1000 mg via ORAL
  Filled 2022-08-06: qty 2

## 2022-08-06 NOTE — ED Provider Notes (Signed)
[facility] Provider Note   CSN: [id number] Arrival date & time: 08/06/22  1806     History Chief Complaint  Patient presents with   Back Pain    HPI Mitchell Jones is a 67 y.o. male presenting for severe back pain.  States that he was bending over to get something off of a desk when he felt a popping and severe pinching on his right leg.  Similar episode 3 weeks ago.  Denies fevers chills nausea vomiting syncope shortness of breath.  Otherwise ambulatory tolerating p.o. intake.  No known sick contacts.  No numbness tingling at this time.  Denies incontinence.   Patient's recorded medical, surgical, social, medication list and allergies were reviewed in the Snapshot window as part of the initial history.   Review of Systems   Review of Systems  Constitutional:  Negative for chills and fever.  HENT:  Negative for ear pain and sore throat.   Eyes:  Negative for pain and visual disturbance.  Respiratory:  Negative for cough and shortness of breath.   Cardiovascular:  Negative for chest pain and palpitations.  Gastrointestinal:  Negative for abdominal pain and vomiting.  Genitourinary:  Negative for dysuria and hematuria.  Musculoskeletal:  Positive for back pain. Negative for arthralgias.  Skin:  Negative for color change and rash.  Neurological:  Negative for seizures and syncope.  All other systems reviewed and are negative.   Physical Exam Updated Vital Signs BP (!) 189/71   Pulse 93   Temp 98.2 F (36.8 C) (Oral)   Resp 18   SpO2 100%  Physical Exam Vitals and nursing note reviewed.  Constitutional:      General: He is not in acute distress.    Appearance: He is well-developed.  HENT:     Head: Normocephalic and atraumatic.  Eyes:     Conjunctiva/sclera: Conjunctivae normal.  Cardiovascular:     Rate and Rhythm: Normal rate and regular rhythm.     Heart sounds: No murmur heard. Pulmonary:     Effort: Pulmonary effort  is normal. No respiratory distress.     Breath sounds: Normal breath sounds.  Abdominal:     Palpations: Abdomen is soft.     Tenderness: There is no abdominal tenderness.  Musculoskeletal:        General: No swelling.     Cervical back: Neck supple.  Skin:    General: Skin is warm and dry.     Capillary Refill: Capillary refill takes less than 2 seconds.  Neurological:     Mental Status: He is alert.  Psychiatric:        Mood and Affect: Mood normal.      ED Course/ Medical Decision Making/ A&P    Procedures Procedures   Medications Ordered in ED Medications  ketorolac (TORADOL) 15 MG/ML injection 15 mg (15 mg Intramuscular Given 08/06/22 1842)  acetaminophen (TYLENOL) tablet 1,000 mg (1,000 mg Oral Given 08/06/22 1842)  oxyCODONE (Oxy IR/ROXICODONE) immediate release tablet 10 mg (10 mg Oral Given 08/06/22 1842)   Medical Decision Making:   Mitchell Jones is a 67 y.o. male who presented to the ED today with acute lower back pain over the past 48 hours, detailed above.    On my initial exam, the pt was with an intact neurologic exam, tolerating ambulation with an antalgic gait and p.o. intake without difficulty.  Patient had no abnormal DTRs, no midline spinal tenderness.  Patient endorsing complete sensation of  the perineum.  Patient without episodes of fecal or urinary incontinence.  Patient has no focal neurologic deficits and reassuring vital signs at this time.  No obvious physical abnormality or injury on exam. Notably, patient denies recent trauma, is afebrile, and denies IVDU.   Reviewed and confirmed nursing documentation for past medical history, family history, social history.    Initial Assessment:   With the patient's presentation of acute back pain in the above setting, most likely diagnosis is musculoskeletal strain. Other diagnoses were considered including (but not limited to) underlying fracture, epidural hematoma, cauda equina syndrome, spinal stenosis, spinal  malignancy. These are considered less likely due to history of present illness and physical exam findings.   In particular, lack of fever, substantial history of IV drug use, or substantial neurologic abnormality is less consistent with epidural abscess versus discitis or other spinal infection.  Initial Plan:  Multimodal pain control described and patient informed on safe usage.  Screening evaluation including below radiographic evaluation reviewed and grossly unremarkable at this time. Patient stable for continued outpatient evaluation and management of their musculoskeletal pains.  Patient referred back to primary care provider for continued evaluation and management.   Initial Study Results:   Radiology  CT Lumbar Spine Wo Contrast  Final Result       Disposition:   Based on the above findings, I believe patient is stable for discharge.    Patient and family educated about specific return precautions for given chief complaint and symptoms.  Patient and family educated about follow-up with PCP and spine surgery.  Patient and family expressed understanding of return precautions and need for follow-up. Patient spoken to regarding all imaging and laboratory results and appropriate follow up for these results. All education provided in verbal and written form and time was allowed for answering of patient questions. Patient discharged.    Emergency Department Medication Summary:   Medications  ketorolac (TORADOL) 15 MG/ML injection 15 mg (15 mg Intramuscular Given 08/06/22 1842)  acetaminophen (TYLENOL) tablet 1,000 mg (1,000 mg Oral Given 08/06/22 1842)  oxyCODONE (Oxy IR/ROXICODONE) immediate release tablet 10 mg (10 mg Oral Given 08/06/22 1842)     Clinical Impression:  1. Acute right-sided low back pain without sciatica      Data Unavailable   Final Clinical Impression(s) / ED Diagnoses Final diagnoses:  Acute right-sided low back pain without sciatica    Rx / DC Orders ED  Discharge Orders          Ordered    cyclobenzaprine (FLEXERIL) 10 MG tablet  2 times daily PRN        08/06/22 2117    oxyCODONE (ROXICODONE) 5 MG immediate release tablet  Every 4 hours PRN        08/06/22 2117              Tretha Sciara, MD 08/06/22 2118

## 2022-08-06 NOTE — ED Notes (Signed)
Pt having 6/10 pain, sts better than arrival, md notified.

## 2022-08-06 NOTE — ED Triage Notes (Signed)
Back pain , pain down right leg. Started this afternoon after picking up a basket

## 2022-08-16 ENCOUNTER — Other Ambulatory Visit (HOSPITAL_COMMUNITY): Payer: Self-pay

## 2022-08-16 MED ORDER — METHYLPREDNISOLONE 4 MG PO TBPK
ORAL_TABLET | ORAL | 0 refills | Status: DC
  Filled 2022-08-16: qty 21, 6d supply, fill #0

## 2022-08-17 ENCOUNTER — Other Ambulatory Visit: Payer: Self-pay | Admitting: Neurosurgery

## 2022-08-17 DIAGNOSIS — M5440 Lumbago with sciatica, unspecified side: Secondary | ICD-10-CM

## 2022-08-18 ENCOUNTER — Ambulatory Visit: Payer: Self-pay | Admitting: Cardiology

## 2022-08-19 ENCOUNTER — Ambulatory Visit
Admission: RE | Admit: 2022-08-19 | Discharge: 2022-08-19 | Disposition: A | Discharge status: Home / Self Care | Payer: Medicare Other | Source: Ambulatory Visit | Attending: Neurosurgery | Admitting: Neurosurgery

## 2022-08-19 DIAGNOSIS — M5440 Lumbago with sciatica, unspecified side: Secondary | ICD-10-CM

## 2022-09-13 ENCOUNTER — Other Ambulatory Visit: Payer: Self-pay

## 2022-10-08 ENCOUNTER — Other Ambulatory Visit (HOSPITAL_COMMUNITY): Payer: Self-pay

## 2022-10-10 ENCOUNTER — Other Ambulatory Visit (HOSPITAL_COMMUNITY): Payer: Self-pay

## 2022-10-10 MED ORDER — CYCLOBENZAPRINE HCL 10 MG PO TABS
10.0000 mg | ORAL_TABLET | Freq: Three times a day (TID) | ORAL | 1 refills | Status: DC | PRN
  Filled 2022-10-10: qty 90, 30d supply, fill #0
  Filled 2023-01-31: qty 90, 30d supply, fill #1

## 2022-10-15 ENCOUNTER — Other Ambulatory Visit (HOSPITAL_COMMUNITY): Payer: Self-pay

## 2022-10-24 ENCOUNTER — Other Ambulatory Visit (HOSPITAL_COMMUNITY): Payer: Self-pay

## 2022-10-24 MED ORDER — TADALAFIL 20 MG PO TABS
ORAL_TABLET | ORAL | 3 refills | Status: AC
  Filled 2022-10-24: qty 30, 30d supply, fill #0
  Filled 2023-04-14 – 2023-04-19 (×2): qty 30, 30d supply, fill #1

## 2022-10-26 ENCOUNTER — Other Ambulatory Visit (HOSPITAL_COMMUNITY): Payer: Self-pay

## 2022-10-27 ENCOUNTER — Other Ambulatory Visit (HOSPITAL_COMMUNITY): Payer: Self-pay

## 2022-10-27 MED ORDER — OLMESARTAN MEDOXOMIL 40 MG PO TABS
40.0000 mg | ORAL_TABLET | Freq: Every evening | ORAL | 4 refills | Status: DC
  Filled 2022-10-27: qty 90, 90d supply, fill #0
  Filled 2023-02-21: qty 90, 90d supply, fill #1
  Filled 2023-06-09: qty 90, 90d supply, fill #2
  Filled 2023-09-09: qty 90, 90d supply, fill #3

## 2023-01-31 ENCOUNTER — Other Ambulatory Visit (HOSPITAL_COMMUNITY): Payer: Self-pay

## 2023-01-31 MED ORDER — ATORVASTATIN CALCIUM 20 MG PO TABS
20.0000 mg | ORAL_TABLET | Freq: Every day | ORAL | 3 refills | Status: DC
  Filled 2023-01-31: qty 90, 90d supply, fill #0
  Filled 2023-05-06: qty 90, 90d supply, fill #1
  Filled 2023-08-08: qty 90, 90d supply, fill #2
  Filled 2023-10-29: qty 90, 90d supply, fill #3

## 2023-02-01 ENCOUNTER — Other Ambulatory Visit (HOSPITAL_COMMUNITY): Payer: Self-pay

## 2023-02-09 ENCOUNTER — Other Ambulatory Visit (HOSPITAL_COMMUNITY): Payer: Self-pay

## 2023-02-09 MED ORDER — TRIAMCINOLONE ACETONIDE 0.1 % EX CREA
TOPICAL_CREAM | Freq: Two times a day (BID) | CUTANEOUS | 2 refills | Status: AC
  Filled 2023-02-09: qty 454, 30d supply, fill #0

## 2023-02-14 ENCOUNTER — Other Ambulatory Visit (HOSPITAL_COMMUNITY): Payer: Self-pay

## 2023-02-27 ENCOUNTER — Ambulatory Visit (INDEPENDENT_AMBULATORY_CARE_PROVIDER_SITE_OTHER): Payer: Medicare Other | Admitting: Podiatry

## 2023-02-27 DIAGNOSIS — M216X9 Other acquired deformities of unspecified foot: Secondary | ICD-10-CM | POA: Diagnosis not present

## 2023-02-27 DIAGNOSIS — L84 Corns and callosities: Secondary | ICD-10-CM | POA: Diagnosis not present

## 2023-02-27 NOTE — Patient Instructions (Signed)
Choose a moisturizer from  the list below:  For normal skin: Moisturize feet once daily; do not apply between toes A.  CeraVe Daily Moisturizing Lotion B.  Lubriderm Advanced Therapy Lotion or Lubriderm Intense Skin Repair Lotion C.  Vaseline Intensive Care Lotion D.  Gold Bond Ultimate Diabetic Foot Lotion E.  Eucerin Intensive Repair Moisturizing Lotion  For extremely dry, cracked feet: moisturize feet once daily; do not apply between toes A. CeraVe Healing Ointment B. Eucerin Aquaphor Repairing Ointment (may be labeled Aquaphor Healing Ointment) C. Vaseline Petroleum Healing Jelly   If you have problems reaching your feet: apply to feet once daily; do not apply between toes A.  Eucerin Aquaphor Ointment Body Spray  B.  Vaseline Intensive Care Spray Moisturizer (Unscented,  Cocoa Radiant Spray or Aloe Smooth Spray)    

## 2023-02-27 NOTE — Progress Notes (Signed)
Subjective: No chief complaint on file.  67 year old male presents the office today with pain to the lateral aspect left foot pointing submetatarsal base.  He states that he was in a exercise class he noticed a callus to the area has been causing pain.  He feels when he walks he is going out of the left foot.  He is not experience this on the right foot.  No injuries.  No treatment.  No other concerns.  Objective: AAO x3, NAD DP/PT pulses palpable bilaterally, CRT less than 3 seconds Hyperkeratotic lesion noted submetatarsal base left foot without any underlying ulceration, drainage or any signs of infection.  There are no open lesions today.  Dry skin is present bilaterally without any skin fissures, open lesions. No pain with calf compression, swelling, warmth, erythema  Assessment: Prominence of metatarsal base  Plan: -All treatment options discussed with the patient including all alternatives, risks, complications.  -I had a lateral posted orthotic to see if this helps.  He also has some inserts and wants to come in to have refurbished.  If this lateral post were to help we can always incorporate this in the orthotic when he gets refurbished. -Extremity with hyperkeratotic lesion x 1 without any complications or bleeding. -Moisturizer daily. -Patient encouraged to call the office with any questions, concerns, change in symptoms.   Vivi Barrack DPM

## 2023-03-07 ENCOUNTER — Other Ambulatory Visit (HOSPITAL_COMMUNITY): Payer: Self-pay

## 2023-03-07 MED ORDER — HYDROCHLOROTHIAZIDE 12.5 MG PO TABS
12.5000 mg | ORAL_TABLET | Freq: Every morning | ORAL | 3 refills | Status: DC
  Filled 2023-03-07: qty 90, 90d supply, fill #0
  Filled 2023-06-09: qty 90, 90d supply, fill #1
  Filled 2023-09-09: qty 90, 90d supply, fill #2
  Filled 2023-12-07: qty 90, 90d supply, fill #3

## 2023-03-10 ENCOUNTER — Other Ambulatory Visit (HOSPITAL_COMMUNITY): Payer: Self-pay

## 2023-03-18 ENCOUNTER — Other Ambulatory Visit (HOSPITAL_COMMUNITY): Payer: Self-pay

## 2023-04-14 ENCOUNTER — Other Ambulatory Visit (HOSPITAL_COMMUNITY): Payer: Self-pay

## 2023-04-14 ENCOUNTER — Other Ambulatory Visit: Payer: Self-pay

## 2023-04-17 ENCOUNTER — Other Ambulatory Visit (HOSPITAL_COMMUNITY): Payer: Self-pay

## 2023-04-19 ENCOUNTER — Other Ambulatory Visit: Payer: Self-pay

## 2023-04-19 ENCOUNTER — Other Ambulatory Visit (HOSPITAL_COMMUNITY): Payer: Self-pay

## 2023-04-20 ENCOUNTER — Other Ambulatory Visit (HOSPITAL_COMMUNITY): Payer: Self-pay

## 2023-04-20 ENCOUNTER — Ambulatory Visit: Payer: Medicare Other

## 2023-04-20 NOTE — Progress Notes (Signed)
Patient was present and fit with re-furbished orthotics that also had Left Lateral post added to help reduce over-supination  Patient is happy with fit and function and will call office if any problems arise  Addison Bailey CPed, CFo, CFm

## 2023-04-24 ENCOUNTER — Other Ambulatory Visit (HOSPITAL_COMMUNITY): Payer: Self-pay

## 2023-05-06 ENCOUNTER — Other Ambulatory Visit (HOSPITAL_COMMUNITY): Payer: Self-pay

## 2023-05-08 ENCOUNTER — Other Ambulatory Visit (HOSPITAL_COMMUNITY): Payer: Self-pay

## 2023-05-08 ENCOUNTER — Other Ambulatory Visit: Payer: Self-pay

## 2023-05-08 MED ORDER — CYCLOBENZAPRINE HCL 10 MG PO TABS
ORAL_TABLET | ORAL | 1 refills | Status: DC
  Filled 2023-05-08: qty 90, 30d supply, fill #0
  Filled 2023-09-09: qty 90, 30d supply, fill #1

## 2023-05-09 ENCOUNTER — Other Ambulatory Visit (HOSPITAL_COMMUNITY): Payer: Self-pay

## 2023-05-17 ENCOUNTER — Other Ambulatory Visit: Payer: Medicare Other

## 2023-05-30 ENCOUNTER — Other Ambulatory Visit: Payer: Medicare Other

## 2023-06-05 ENCOUNTER — Other Ambulatory Visit: Payer: Medicare Other

## 2023-06-09 ENCOUNTER — Other Ambulatory Visit: Payer: Self-pay

## 2023-06-09 ENCOUNTER — Other Ambulatory Visit (HOSPITAL_COMMUNITY): Payer: Self-pay

## 2023-06-09 MED ORDER — PANTOPRAZOLE SODIUM 40 MG PO TBEC
40.0000 mg | DELAYED_RELEASE_TABLET | Freq: Every day | ORAL | 3 refills | Status: DC
  Filled 2023-06-09: qty 90, 90d supply, fill #0
  Filled 2023-09-09: qty 90, 90d supply, fill #1
  Filled 2023-12-07: qty 90, 90d supply, fill #2
  Filled 2024-03-05: qty 90, 90d supply, fill #3

## 2023-08-02 ENCOUNTER — Ambulatory Visit: Attending: Internal Medicine

## 2023-08-02 ENCOUNTER — Other Ambulatory Visit: Payer: Self-pay | Admitting: *Deleted

## 2023-08-02 DIAGNOSIS — I493 Ventricular premature depolarization: Secondary | ICD-10-CM

## 2023-08-02 DIAGNOSIS — R001 Bradycardia, unspecified: Secondary | ICD-10-CM

## 2023-08-02 NOTE — Progress Notes (Unsigned)
 Enrolled for [employer] to mail a ZIO XT long term holter monitor to the patients address on file.   EP to read

## 2023-08-19 DIAGNOSIS — R001 Bradycardia, unspecified: Secondary | ICD-10-CM

## 2023-08-19 DIAGNOSIS — I493 Ventricular premature depolarization: Secondary | ICD-10-CM | POA: Diagnosis not present

## 2023-09-12 ENCOUNTER — Other Ambulatory Visit (HOSPITAL_COMMUNITY): Payer: Self-pay

## 2023-09-19 ENCOUNTER — Other Ambulatory Visit (HOSPITAL_COMMUNITY): Payer: Self-pay

## 2023-10-29 ENCOUNTER — Other Ambulatory Visit (HOSPITAL_COMMUNITY): Payer: Self-pay

## 2023-10-30 ENCOUNTER — Other Ambulatory Visit: Payer: Self-pay

## 2023-11-06 ENCOUNTER — Other Ambulatory Visit (HOSPITAL_COMMUNITY): Payer: Self-pay

## 2023-12-07 ENCOUNTER — Other Ambulatory Visit: Payer: Self-pay

## 2023-12-07 ENCOUNTER — Other Ambulatory Visit (HOSPITAL_COMMUNITY): Payer: Self-pay

## 2023-12-11 ENCOUNTER — Other Ambulatory Visit (HOSPITAL_COMMUNITY): Payer: Self-pay

## 2023-12-13 ENCOUNTER — Other Ambulatory Visit (HOSPITAL_COMMUNITY): Payer: Self-pay

## 2023-12-13 MED ORDER — TADALAFIL 20 MG PO TABS
ORAL_TABLET | ORAL | 3 refills | Status: AC
  Filled 2023-12-13: qty 30, 30d supply, fill #0
  Filled 2024-05-04: qty 30, 30d supply, fill #1

## 2023-12-21 ENCOUNTER — Other Ambulatory Visit (HOSPITAL_COMMUNITY): Payer: Self-pay

## 2023-12-27 ENCOUNTER — Other Ambulatory Visit (HOSPITAL_COMMUNITY): Payer: Self-pay

## 2023-12-27 MED ORDER — OLMESARTAN MEDOXOMIL 40 MG PO TABS
40.0000 mg | ORAL_TABLET | Freq: Every evening | ORAL | 4 refills | Status: AC
  Filled 2023-12-27: qty 90, 90d supply, fill #0
  Filled 2024-05-04: qty 90, 90d supply, fill #1

## 2023-12-29 ENCOUNTER — Other Ambulatory Visit (HOSPITAL_COMMUNITY): Payer: Self-pay

## 2023-12-29 MED ORDER — CYCLOBENZAPRINE HCL 10 MG PO TABS
ORAL_TABLET | ORAL | 0 refills | Status: DC
  Filled 2023-12-29: qty 90, 30d supply, fill #0

## 2024-02-06 ENCOUNTER — Other Ambulatory Visit (HOSPITAL_COMMUNITY): Payer: Self-pay

## 2024-02-06 MED ORDER — ATORVASTATIN CALCIUM 20 MG PO TABS
20.0000 mg | ORAL_TABLET | Freq: Every day | ORAL | 3 refills | Status: AC
  Filled 2024-02-06: qty 90, 90d supply, fill #0
  Filled 2024-05-04: qty 90, 90d supply, fill #1

## 2024-02-08 ENCOUNTER — Encounter: Payer: Self-pay | Admitting: Podiatry

## 2024-02-08 ENCOUNTER — Ambulatory Visit

## 2024-02-08 ENCOUNTER — Ambulatory Visit (INDEPENDENT_AMBULATORY_CARE_PROVIDER_SITE_OTHER): Admitting: Podiatry

## 2024-02-08 VITALS — Ht 70.0 in | Wt 250.4 lb

## 2024-02-08 DIAGNOSIS — M216X9 Other acquired deformities of unspecified foot: Secondary | ICD-10-CM

## 2024-02-08 DIAGNOSIS — L84 Corns and callosities: Secondary | ICD-10-CM | POA: Diagnosis not present

## 2024-02-08 NOTE — Patient Instructions (Signed)
  Choose a moisturizer from  the list below:  For normal skin: Moisturize feet once daily; do not apply between toes CeraVe Daily Moisturizing Lotion Lubriderm Advanced Therapy Lotion or Lubriderm Intense Skin Repair Lotion Aquaphor Intensive Repair Lotion Gold Bond Ultimate Diabetic Foot Lotion Eucerin Intensive Repair Moisturizing Lotion  For extremely dry, cracked feet: moisturize feet once daily; do not apply between toes Bag Balm Skin Moisturizer (green tin can) CeraVe Healing Ointment Eucerin Aquaphor Advanced Repair Cream Vaseline Petroleum Healing Jelly   If you have problems reaching your feet: apply to feet once daily; do not apply between toes Aquaphor Advanced Therapy Ointment Body Spray  Vaseline Intensive Care Spray Moisturizer (Unscented,  Cocoa Radiant Spray or Aloe Smooth Spray)

## 2024-02-12 NOTE — Progress Notes (Unsigned)
 One pair of orthotics sent to [facility] to be remade more flexible patient states they have hurt feet from beginning and has not been able to wear   Looking back I see that this was a pair patient had from another provider that we sent back to [facility] for refurbish  I will call patient and let him know we would need new scans and make new Orthotics for him if I can't make these more flexible   --  Subjective: Chief Complaint  Patient presents with   Callouses    Pt is here due to corn on the bottom of the left foot, also want his orthotics checked out.     68 year old male presents the office today with concerns of a skin lesion of the bottom of his left foot and also to have his orthotics checked.  He would like to see if some of them will have them refurbished.  He also has 1 pair is not as comfortable.  No recent injuries or changes.   Objective: AAO x3, NAD DP/PT pulses palpable bilaterally, CRT less than 3 seconds Hyperkeratotic lesion noted submetatarsal base left foot without any underlying ulceration, drainage or any signs of infection.  There are no open lesions today.  Dry skin is present bilaterally without any skin fissures, open lesions. Supination noted. No pain with calf compression, swelling, warmth, erythema  Assessment: Prominence of metatarsal base  Plan: -All treatment options discussed with the patient including all alternatives, risks, complications.  -As a courtesy debride the callus with any complications or bleeding.  Does appear that the 1 pair of orthotics is thicker than the others and he also has a lateral post help or event rolling.  I had Sueanne, pedorthist evaluate the inserts today as well for modifications and possible refurbishing.  Return if symptoms worsen or fail to improve.  Donnice JONELLE Fees DPM

## 2024-02-13 ENCOUNTER — Other Ambulatory Visit (HOSPITAL_COMMUNITY): Payer: Self-pay

## 2024-02-13 MED ORDER — FLUOROURACIL 5 % EX CREA
TOPICAL_CREAM | CUTANEOUS | 0 refills | Status: AC
  Filled 2024-02-13: qty 40, 14d supply, fill #0

## 2024-02-20 ENCOUNTER — Other Ambulatory Visit (HOSPITAL_COMMUNITY): Payer: Self-pay

## 2024-02-20 MED ORDER — SULFACETAMIDE SODIUM (ACNE) 10 % EX LOTN
TOPICAL_LOTION | CUTANEOUS | 5 refills | Status: AC
  Filled 2024-02-20: qty 118, 30d supply, fill #0

## 2024-02-20 MED ORDER — HYDROCHLOROTHIAZIDE 12.5 MG PO TABS
12.5000 mg | ORAL_TABLET | Freq: Every day | ORAL | 3 refills | Status: AC
  Filled 2024-02-20: qty 90, 90d supply, fill #0
  Filled 2024-05-28: qty 90, 90d supply, fill #1

## 2024-02-21 ENCOUNTER — Other Ambulatory Visit (HOSPITAL_COMMUNITY): Payer: Self-pay

## 2024-02-22 ENCOUNTER — Other Ambulatory Visit (HOSPITAL_COMMUNITY): Payer: Self-pay

## 2024-03-06 ENCOUNTER — Other Ambulatory Visit (HOSPITAL_COMMUNITY): Payer: Self-pay

## 2024-03-06 MED ORDER — DOXYCYCLINE HYCLATE 100 MG PO CAPS
100.0000 mg | ORAL_CAPSULE | Freq: Two times a day (BID) | ORAL | 0 refills | Status: AC
  Filled 2024-03-06: qty 10, 5d supply, fill #0

## 2024-03-19 ENCOUNTER — Other Ambulatory Visit (HOSPITAL_COMMUNITY): Payer: Self-pay

## 2024-03-19 MED ORDER — CYCLOBENZAPRINE HCL 10 MG PO TABS
ORAL_TABLET | ORAL | 0 refills | Status: AC
  Filled 2024-03-19: qty 90, 30d supply, fill #0

## 2024-05-06 ENCOUNTER — Other Ambulatory Visit (HOSPITAL_COMMUNITY): Payer: Self-pay

## 2024-05-31 ENCOUNTER — Ambulatory Visit: Admitting: Podiatrist

## 2024-05-31 ENCOUNTER — Telehealth: Payer: Self-pay | Admitting: Podiatry

## 2024-05-31 ENCOUNTER — Ambulatory Visit: Admitting: Podiatry

## 2024-05-31 ENCOUNTER — Telehealth: Payer: Self-pay

## 2024-05-31 DIAGNOSIS — M216X9 Other acquired deformities of unspecified foot: Secondary | ICD-10-CM | POA: Diagnosis not present

## 2024-05-31 DIAGNOSIS — M21619 Bunion of unspecified foot: Secondary | ICD-10-CM | POA: Diagnosis not present

## 2024-05-31 DIAGNOSIS — L84 Corns and callosities: Secondary | ICD-10-CM

## 2024-05-31 NOTE — Progress Notes (Signed)
 Patient presents for an orthotic scan  He tried to get orthotics refurbished but [facility] was unable to do this due to the request for making them more flexible and they were not an original [facility] orthotic.  They never sent back these orthotics to us .  -  he was advised to come in for a scan and that these new orthotics would be complimentary since the old ones were never given back and are now unable to be found.    Sport type orthotic was discussed with blue fabric etc topcover.   He will be notified when orthotics are ready for pick up.

## 2024-05-31 NOTE — Progress Notes (Signed)
 Subjective: Chief Complaint  Patient presents with   Callouses    Patient presents today c/o of B/L Callous.     69 year old male presents the office today with pain to the lateral aspect left foot pointing submetatarsal base as well as for tenderness on the area of the right bunion.  He states that the skin lesions come back on the right foot and causes discomfort.  No swelling, redness or any drainage.  He states on the area of the bunions had some irritation.  Awaiting new inserts.  No recent injuries or changes otherwise.  Objective: AAO x3, NAD DP/PT pulses palpable bilaterally, CRT less than 3 seconds Hyperkeratotic lesion noted submetatarsal base left foot without any underlying ulceration, drainage or any signs of infection.  There are no open lesions today.  Dry skin is present bilaterally without any skin fissures, open lesions. Bunion is present there is mild erythema from irritation, inflammation on the bunion but there is no skin breakdown, warmth.  No signs of infection. Cavus foot type.  No pain with calf compression, swelling, warmth, erythema  Assessment: Prominence of metatarsal base  Plan: -All treatment options discussed with the patient including all alternatives, risks, complications.  -Should be debrided hyperkeratotic lesion left foot without any complications or bleeding.  Discussed moisturizer daily as well as offloading. -Dispensed offloading pads for the bunion discussed Voltaren gel. -Awaiting refurbished orthotics. -Monitoring signs or symptoms of infection and/or skin breakdown.  Return if symptoms worsen or fail to improve.  Donnice JONELLE Fees DPM

## 2024-05-31 NOTE — Patient Instructions (Signed)
 Bunion: What to Know A bunion, or hallux valgus, is a bump that forms slowly on the inner side of your big toe joint. It happens when your big toe turns toward your second toe. Bunions may be small at first but get bigger over time. They can make walking painful. What are the causes? A bunion may be caused by: Wearing narrow or pointed shoes that force your big toe to press against the other toes. Problems with how your foot is shaped. Changes in your foot caused by some diseases or conditions. A foot injury. What increases the risk? You're more likely to get a bunion if: You wear shoes that squeeze your toes. You have certain diseases, such as: Rheumatoid arthritis. Cerebral palsy. Someone in your family gets bunions too. You have flat feet or low arches. You do things that put a lot of pressure on your feet, such as ballet. What are the signs or symptoms? The main symptom is a bump on the inner side of your big toe. You may also have: Pain. Redness and swelling around your big toe. Thick or hard skin on your big toe or between your toes. Stiffness or loss of movement in your big toe. Trouble walking. How is this diagnosed? A bunion may be diagnosed based on your symptoms, medical history, and activities.  You may also have tests, such as an X-ray. This helps your health care provider see the bones in your foot and look for damage to your joint. How is this treated? Treatment can help with symptoms and can stop the bunion from getting worse. What you need to do may depend on how bad your symptoms are. You may need to: Wear shoes that have a wide toe box. Use bunion pads to cushion your toes. Tape your toes together. Place an insert called an orthotic device in your shoe. This can help take pressure off your toe joint. Take medicine to help with pain and swelling. Put ice or heat on your foot. Do stretching exercises. Have surgery. You may need this if the bunion is causing very  bad symptoms. Follow these instructions at home: Managing pain, stiffness, and swelling     Use ice or an ice pack as told. Place a towel between your skin and the ice. Leave the ice on for 20 minutes, 2-3 times a day. Use heat as told. Use the heat source that your provider recommends, such as a moist heat pack or a heating pad. Do this as often as told. Place a towel between your skin and the heat source. Leave the heat on for 20-30 minutes. If your skin turns red, take off the ice or heat right away to prevent skin damage. The risk of damage is higher if you can't feel pain, heat, or cold. General instructions Exercise as told. Support your toe joint as told with: The right footwear. Shoe padding. Taping. Wear shoes that have a wide toe box. Avoid wearing tight shoes or shoes with high heels. Take your medicines only as told. Do not smoke, vape, or use nicotine or tobacco. Keep all follow-up visits. Your provider will check if the treatments are working. Contact a health care provider if: Your symptoms get worse. Your symptoms don't get better in 2 weeks. Get help right away if: You have very bad pain and trouble walking. This information is not intended to replace advice given to you by your health care provider. Make sure you discuss any questions you have with your health  care provider. Document Revised: 11/11/2022 Document Reviewed: 11/11/2022 [employer] Patient Education  2024 [employer].

## 2024-05-31 NOTE — Telephone Encounter (Signed)
 Spoke to Darlene at [facility]. She stated that the orthotics are not [facility] orthotics and if they refurbished them, they can't guarantee they would work. I spoke to Dr. Gershon and we will rescan for new orthotics.

## 2024-06-11 ENCOUNTER — Other Ambulatory Visit (HOSPITAL_COMMUNITY): Payer: Self-pay

## 2024-06-11 ENCOUNTER — Other Ambulatory Visit: Payer: Self-pay

## 2024-06-11 MED ORDER — PANTOPRAZOLE SODIUM 40 MG PO TBEC
40.0000 mg | DELAYED_RELEASE_TABLET | Freq: Every day | ORAL | 0 refills | Status: AC
  Filled 2024-06-11: qty 90, 90d supply, fill #0
